# Patient Record
Sex: Female | Born: 2000 | Race: Black or African American | Hispanic: No | Marital: Single | Smoking: Never smoker
Health system: Southern US, Community
[De-identification: ages and names within clinical notes are randomized; demographics above are authoritative.]

## PROBLEM LIST (undated history)

## (undated) DIAGNOSIS — G40909 Epilepsy, unspecified, not intractable, without status epilepticus: Secondary | ICD-10-CM

## (undated) DIAGNOSIS — D649 Anemia, unspecified: Secondary | ICD-10-CM

## (undated) HISTORY — DX: Anemia, unspecified: D64.9

## (undated) HISTORY — DX: Epilepsy, unspecified, not intractable, without status epilepticus: G40.909

---
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
## 2020-02-04 DIAGNOSIS — F4329 Adjustment disorder with other symptoms: Secondary | ICD-10-CM | POA: Diagnosis not present

## 2020-02-04 DIAGNOSIS — G43009 Migraine without aura, not intractable, without status migrainosus: Secondary | ICD-10-CM | POA: Diagnosis not present

## 2020-02-04 DIAGNOSIS — F819 Developmental disorder of scholastic skills, unspecified: Secondary | ICD-10-CM | POA: Diagnosis not present

## 2020-02-04 DIAGNOSIS — G40309 Generalized idiopathic epilepsy and epileptic syndromes, not intractable, without status epilepticus: Secondary | ICD-10-CM | POA: Diagnosis not present

## 2020-02-25 DIAGNOSIS — G40309 Generalized idiopathic epilepsy and epileptic syndromes, not intractable, without status epilepticus: Secondary | ICD-10-CM | POA: Diagnosis not present

## 2020-08-04 DIAGNOSIS — G43009 Migraine without aura, not intractable, without status migrainosus: Secondary | ICD-10-CM | POA: Diagnosis not present

## 2020-08-04 DIAGNOSIS — F819 Developmental disorder of scholastic skills, unspecified: Secondary | ICD-10-CM | POA: Diagnosis not present

## 2020-08-04 DIAGNOSIS — G40309 Generalized idiopathic epilepsy and epileptic syndromes, not intractable, without status epilepticus: Secondary | ICD-10-CM | POA: Diagnosis not present

## 2020-08-04 DIAGNOSIS — F4329 Adjustment disorder with other symptoms: Secondary | ICD-10-CM | POA: Diagnosis not present

## 2020-11-13 DIAGNOSIS — F411 Generalized anxiety disorder: Secondary | ICD-10-CM | POA: Diagnosis not present

## 2020-11-13 DIAGNOSIS — F338 Other recurrent depressive disorders: Secondary | ICD-10-CM | POA: Diagnosis not present

## 2020-11-20 DIAGNOSIS — F411 Generalized anxiety disorder: Secondary | ICD-10-CM | POA: Diagnosis not present

## 2020-11-20 DIAGNOSIS — F338 Other recurrent depressive disorders: Secondary | ICD-10-CM | POA: Diagnosis not present

## 2020-11-27 DIAGNOSIS — F338 Other recurrent depressive disorders: Secondary | ICD-10-CM | POA: Diagnosis not present

## 2020-11-27 DIAGNOSIS — F411 Generalized anxiety disorder: Secondary | ICD-10-CM | POA: Diagnosis not present

## 2020-11-29 DIAGNOSIS — H5213 Myopia, bilateral: Secondary | ICD-10-CM | POA: Diagnosis not present

## 2020-12-18 DIAGNOSIS — F411 Generalized anxiety disorder: Secondary | ICD-10-CM | POA: Diagnosis not present

## 2020-12-18 DIAGNOSIS — F338 Other recurrent depressive disorders: Secondary | ICD-10-CM | POA: Diagnosis not present

## 2020-12-19 DIAGNOSIS — H5213 Myopia, bilateral: Secondary | ICD-10-CM | POA: Diagnosis not present

## 2020-12-25 DIAGNOSIS — F338 Other recurrent depressive disorders: Secondary | ICD-10-CM | POA: Diagnosis not present

## 2020-12-25 DIAGNOSIS — F411 Generalized anxiety disorder: Secondary | ICD-10-CM | POA: Diagnosis not present

## 2021-01-01 DIAGNOSIS — F338 Other recurrent depressive disorders: Secondary | ICD-10-CM | POA: Diagnosis not present

## 2021-01-01 DIAGNOSIS — F411 Generalized anxiety disorder: Secondary | ICD-10-CM | POA: Diagnosis not present

## 2021-01-08 DIAGNOSIS — F411 Generalized anxiety disorder: Secondary | ICD-10-CM | POA: Diagnosis not present

## 2021-01-08 DIAGNOSIS — F338 Other recurrent depressive disorders: Secondary | ICD-10-CM | POA: Diagnosis not present

## 2021-01-18 DIAGNOSIS — F338 Other recurrent depressive disorders: Secondary | ICD-10-CM | POA: Diagnosis not present

## 2021-01-18 DIAGNOSIS — F411 Generalized anxiety disorder: Secondary | ICD-10-CM | POA: Diagnosis not present

## 2021-01-22 DIAGNOSIS — F338 Other recurrent depressive disorders: Secondary | ICD-10-CM | POA: Diagnosis not present

## 2021-01-22 DIAGNOSIS — F411 Generalized anxiety disorder: Secondary | ICD-10-CM | POA: Diagnosis not present

## 2021-01-29 DIAGNOSIS — F338 Other recurrent depressive disorders: Secondary | ICD-10-CM | POA: Diagnosis not present

## 2021-01-29 DIAGNOSIS — F411 Generalized anxiety disorder: Secondary | ICD-10-CM | POA: Diagnosis not present

## 2021-02-05 ENCOUNTER — Ambulatory Visit
Admission: RE | Admit: 2021-02-05 | Discharge: 2021-02-05 | Disposition: A | Discharge status: Home / Self Care | Payer: Disability Insurance | Source: Ambulatory Visit | Attending: *Deleted | Admitting: *Deleted

## 2021-02-05 ENCOUNTER — Other Ambulatory Visit: Payer: Self-pay

## 2021-02-05 ENCOUNTER — Ambulatory Visit
Admission: EM | Admit: 2021-02-05 | Discharge: 2021-02-05 | Disposition: A | Discharge status: Home / Self Care | Payer: Disability Insurance

## 2021-02-05 DIAGNOSIS — M25562 Pain in left knee: Secondary | ICD-10-CM

## 2021-02-05 DIAGNOSIS — F411 Generalized anxiety disorder: Secondary | ICD-10-CM | POA: Diagnosis not present

## 2021-02-05 DIAGNOSIS — M25561 Pain in right knee: Secondary | ICD-10-CM | POA: Insufficient documentation

## 2021-02-05 DIAGNOSIS — F338 Other recurrent depressive disorders: Secondary | ICD-10-CM | POA: Diagnosis not present

## 2021-02-09 DIAGNOSIS — F4329 Adjustment disorder with other symptoms: Secondary | ICD-10-CM | POA: Diagnosis not present

## 2021-02-09 DIAGNOSIS — F819 Developmental disorder of scholastic skills, unspecified: Secondary | ICD-10-CM | POA: Diagnosis not present

## 2021-02-09 DIAGNOSIS — G43009 Migraine without aura, not intractable, without status migrainosus: Secondary | ICD-10-CM | POA: Diagnosis not present

## 2021-02-09 DIAGNOSIS — G40309 Generalized idiopathic epilepsy and epileptic syndromes, not intractable, without status epilepticus: Secondary | ICD-10-CM | POA: Diagnosis not present

## 2021-02-12 DIAGNOSIS — F338 Other recurrent depressive disorders: Secondary | ICD-10-CM | POA: Diagnosis not present

## 2021-02-12 DIAGNOSIS — F411 Generalized anxiety disorder: Secondary | ICD-10-CM | POA: Diagnosis not present

## 2021-02-19 DIAGNOSIS — G40A09 Absence epileptic syndrome, not intractable, without status epilepticus: Secondary | ICD-10-CM | POA: Diagnosis not present

## 2021-02-19 DIAGNOSIS — Z862 Personal history of diseases of the blood and blood-forming organs and certain disorders involving the immune mechanism: Secondary | ICD-10-CM | POA: Diagnosis not present

## 2021-02-19 DIAGNOSIS — Z23 Encounter for immunization: Secondary | ICD-10-CM | POA: Diagnosis not present

## 2021-02-19 DIAGNOSIS — N920 Excessive and frequent menstruation with regular cycle: Secondary | ICD-10-CM | POA: Diagnosis not present

## 2021-02-19 DIAGNOSIS — F411 Generalized anxiety disorder: Secondary | ICD-10-CM | POA: Diagnosis not present

## 2021-02-19 DIAGNOSIS — F5089 Other specified eating disorder: Secondary | ICD-10-CM | POA: Diagnosis not present

## 2021-02-19 DIAGNOSIS — Z0000 Encounter for general adult medical examination without abnormal findings: Secondary | ICD-10-CM | POA: Diagnosis not present

## 2021-02-19 DIAGNOSIS — F338 Other recurrent depressive disorders: Secondary | ICD-10-CM | POA: Diagnosis not present

## 2021-02-26 DIAGNOSIS — F338 Other recurrent depressive disorders: Secondary | ICD-10-CM | POA: Diagnosis not present

## 2021-02-26 DIAGNOSIS — F411 Generalized anxiety disorder: Secondary | ICD-10-CM | POA: Diagnosis not present

## 2021-03-05 DIAGNOSIS — F338 Other recurrent depressive disorders: Secondary | ICD-10-CM | POA: Diagnosis not present

## 2021-03-05 DIAGNOSIS — F411 Generalized anxiety disorder: Secondary | ICD-10-CM | POA: Diagnosis not present

## 2021-03-12 DIAGNOSIS — F411 Generalized anxiety disorder: Secondary | ICD-10-CM | POA: Diagnosis not present

## 2021-03-12 DIAGNOSIS — F338 Other recurrent depressive disorders: Secondary | ICD-10-CM | POA: Diagnosis not present

## 2021-03-26 DIAGNOSIS — F411 Generalized anxiety disorder: Secondary | ICD-10-CM | POA: Diagnosis not present

## 2021-03-26 DIAGNOSIS — F338 Other recurrent depressive disorders: Secondary | ICD-10-CM | POA: Diagnosis not present

## 2021-03-30 DIAGNOSIS — G43009 Migraine without aura, not intractable, without status migrainosus: Secondary | ICD-10-CM | POA: Diagnosis not present

## 2021-03-30 DIAGNOSIS — F4329 Adjustment disorder with other symptoms: Secondary | ICD-10-CM | POA: Diagnosis not present

## 2021-03-30 DIAGNOSIS — G40309 Generalized idiopathic epilepsy and epileptic syndromes, not intractable, without status epilepticus: Secondary | ICD-10-CM | POA: Diagnosis not present

## 2021-03-30 DIAGNOSIS — F819 Developmental disorder of scholastic skills, unspecified: Secondary | ICD-10-CM | POA: Diagnosis not present

## 2021-04-02 DIAGNOSIS — F338 Other recurrent depressive disorders: Secondary | ICD-10-CM | POA: Diagnosis not present

## 2021-04-02 DIAGNOSIS — F411 Generalized anxiety disorder: Secondary | ICD-10-CM | POA: Diagnosis not present

## 2021-04-09 DIAGNOSIS — F338 Other recurrent depressive disorders: Secondary | ICD-10-CM | POA: Diagnosis not present

## 2021-04-09 DIAGNOSIS — F411 Generalized anxiety disorder: Secondary | ICD-10-CM | POA: Diagnosis not present

## 2021-04-23 DIAGNOSIS — F411 Generalized anxiety disorder: Secondary | ICD-10-CM | POA: Diagnosis not present

## 2021-04-23 DIAGNOSIS — F338 Other recurrent depressive disorders: Secondary | ICD-10-CM | POA: Diagnosis not present

## 2021-05-07 DIAGNOSIS — F338 Other recurrent depressive disorders: Secondary | ICD-10-CM | POA: Diagnosis not present

## 2021-05-07 DIAGNOSIS — F411 Generalized anxiety disorder: Secondary | ICD-10-CM | POA: Diagnosis not present

## 2021-05-18 DIAGNOSIS — F411 Generalized anxiety disorder: Secondary | ICD-10-CM | POA: Diagnosis not present

## 2021-05-18 DIAGNOSIS — F338 Other recurrent depressive disorders: Secondary | ICD-10-CM | POA: Diagnosis not present

## 2021-06-01 DIAGNOSIS — F411 Generalized anxiety disorder: Secondary | ICD-10-CM | POA: Diagnosis not present

## 2021-06-01 DIAGNOSIS — F338 Other recurrent depressive disorders: Secondary | ICD-10-CM | POA: Diagnosis not present

## 2021-06-11 DIAGNOSIS — F411 Generalized anxiety disorder: Secondary | ICD-10-CM | POA: Diagnosis not present

## 2021-06-11 DIAGNOSIS — F338 Other recurrent depressive disorders: Secondary | ICD-10-CM | POA: Diagnosis not present

## 2021-06-18 DIAGNOSIS — F338 Other recurrent depressive disorders: Secondary | ICD-10-CM | POA: Diagnosis not present

## 2021-06-18 DIAGNOSIS — F411 Generalized anxiety disorder: Secondary | ICD-10-CM | POA: Diagnosis not present

## 2021-06-25 DIAGNOSIS — F411 Generalized anxiety disorder: Secondary | ICD-10-CM | POA: Diagnosis not present

## 2021-06-25 DIAGNOSIS — F338 Other recurrent depressive disorders: Secondary | ICD-10-CM | POA: Diagnosis not present

## 2021-07-06 DIAGNOSIS — F32A Depression, unspecified: Secondary | ICD-10-CM | POA: Diagnosis not present

## 2021-07-06 DIAGNOSIS — F411 Generalized anxiety disorder: Secondary | ICD-10-CM | POA: Diagnosis not present

## 2021-07-06 DIAGNOSIS — F338 Other recurrent depressive disorders: Secondary | ICD-10-CM | POA: Diagnosis not present

## 2021-07-09 DIAGNOSIS — F411 Generalized anxiety disorder: Secondary | ICD-10-CM | POA: Diagnosis not present

## 2021-07-09 DIAGNOSIS — F338 Other recurrent depressive disorders: Secondary | ICD-10-CM | POA: Diagnosis not present

## 2021-07-16 DIAGNOSIS — F411 Generalized anxiety disorder: Secondary | ICD-10-CM | POA: Diagnosis not present

## 2021-07-16 DIAGNOSIS — F338 Other recurrent depressive disorders: Secondary | ICD-10-CM | POA: Diagnosis not present

## 2021-07-30 DIAGNOSIS — F338 Other recurrent depressive disorders: Secondary | ICD-10-CM | POA: Diagnosis not present

## 2021-07-30 DIAGNOSIS — F411 Generalized anxiety disorder: Secondary | ICD-10-CM | POA: Diagnosis not present

## 2021-08-07 DIAGNOSIS — F411 Generalized anxiety disorder: Secondary | ICD-10-CM | POA: Diagnosis not present

## 2021-08-07 DIAGNOSIS — F338 Other recurrent depressive disorders: Secondary | ICD-10-CM | POA: Diagnosis not present

## 2021-08-24 DIAGNOSIS — F411 Generalized anxiety disorder: Secondary | ICD-10-CM | POA: Diagnosis not present

## 2021-08-24 DIAGNOSIS — F338 Other recurrent depressive disorders: Secondary | ICD-10-CM | POA: Diagnosis not present

## 2021-10-05 DIAGNOSIS — F819 Developmental disorder of scholastic skills, unspecified: Secondary | ICD-10-CM | POA: Diagnosis not present

## 2021-10-05 DIAGNOSIS — G40309 Generalized idiopathic epilepsy and epileptic syndromes, not intractable, without status epilepticus: Secondary | ICD-10-CM | POA: Diagnosis not present

## 2021-10-05 DIAGNOSIS — F4329 Adjustment disorder with other symptoms: Secondary | ICD-10-CM | POA: Diagnosis not present

## 2021-10-05 DIAGNOSIS — G43009 Migraine without aura, not intractable, without status migrainosus: Secondary | ICD-10-CM | POA: Diagnosis not present

## 2021-12-06 ENCOUNTER — Ambulatory Visit (INDEPENDENT_AMBULATORY_CARE_PROVIDER_SITE_OTHER): Payer: Medicaid Other | Admitting: Family Medicine

## 2021-12-06 ENCOUNTER — Encounter: Payer: Self-pay | Admitting: Family Medicine

## 2021-12-06 ENCOUNTER — Other Ambulatory Visit: Payer: Self-pay

## 2021-12-06 VITALS — BP 122/64 | HR 94 | Ht 63.0 in | Wt 199.0 lb

## 2021-12-06 DIAGNOSIS — Z862 Personal history of diseases of the blood and blood-forming organs and certain disorders involving the immune mechanism: Secondary | ICD-10-CM

## 2021-12-06 DIAGNOSIS — Z7689 Persons encountering health services in other specified circumstances: Secondary | ICD-10-CM

## 2021-12-06 DIAGNOSIS — Z124 Encounter for screening for malignant neoplasm of cervix: Secondary | ICD-10-CM

## 2021-12-06 DIAGNOSIS — G40909 Epilepsy, unspecified, not intractable, without status epilepticus: Secondary | ICD-10-CM | POA: Diagnosis not present

## 2021-12-06 MED ORDER — FERROUS SULFATE 325 (65 FE) MG PO TABS: 325.0000 mg | ORAL_TABLET | Freq: Every day | ORAL | 1 refills | Status: AC

## 2021-12-06 NOTE — Progress Notes (Signed)
Date:  12/06/2021   Name:  Gail Hill   DOB:  2001-06-28   MRN:  [id number]   Chief Complaint: No chief complaint on file.  Patient is a 21 year old female who presents for establish care. The patient reports the following problems: referral for neurology and gynecology. Health maintenance has been reviewed by myself     No results found for: NA, K, CO2, GLUCOSE, BUN, CREATININE, CALCIUM, EGFR, GFRNONAA, GLUCOSE No results found for: CHOL, HDL, LDLCALC, LDLDIRECT, TRIG, CHOLHDL No results found for: TSH No results found for: HGBA1C No results found for: WBC, HGB, HCT, MCV, PLT No results found for: ALT, AST, GGT, ALKPHOS, BILITOT No results found for: 25OHVITD2, 25OHVITD3, VD25OH   Review of Systems  Constitutional:  Negative for chills and fever.  HENT:  Negative for drooling, ear discharge, ear pain and sore throat.   Respiratory:  Negative for cough, shortness of breath and wheezing.   Cardiovascular:  Negative for chest pain, palpitations and leg swelling.  Gastrointestinal:  Negative for abdominal pain, blood in stool, constipation, diarrhea and nausea.  Endocrine: Negative for polydipsia.  Genitourinary:  Negative for dysuria, frequency, hematuria and urgency.  Musculoskeletal:  Negative for back pain, myalgias and neck pain.  Skin:  Negative for rash.  Allergic/Immunologic: Negative for environmental allergies.  Neurological:  Negative for dizziness and headaches.  Hematological:  Does not bruise/bleed easily.  Psychiatric/Behavioral:  Negative for suicidal ideas. The patient is not nervous/anxious.    There are no problems to display for this patient.   Not on File  History reviewed. No pertinent surgical history.      Medication list has been reviewed and updated.  No outpatient medications have been marked as taking for the 12/06/21 encounter (Office Visit) with Juline Patch, MD.    No flowsheet data found.  No flowsheet data found.  BP Readings  from Last 3 Encounters:  No data found for BP    Physical Exam Vitals and nursing note reviewed.  Constitutional:      Appearance: She is well-developed.  HENT:     Head: Normocephalic.     Right Ear: Tympanic membrane, ear canal and external ear normal.     Left Ear: Tympanic membrane, ear canal and external ear normal.     Nose: Nose normal.     Mouth/Throat:     Lips: Pink.     Mouth: Mucous membranes are moist. Mucous membranes are pale.  Eyes:     General: Lids are everted, no foreign bodies appreciated. No scleral icterus.       Left eye: No foreign body or hordeolum.     Conjunctiva/sclera: Conjunctivae normal.     Right eye: Right conjunctiva is not injected.     Left eye: Left conjunctiva is not injected.     Pupils: Pupils are equal, round, and reactive to light.  Neck:     Thyroid: No thyromegaly.     Vascular: No JVD.     Trachea: No tracheal deviation.  Cardiovascular:     Rate and Rhythm: Normal rate and regular rhythm.     Heart sounds: Normal heart sounds. No murmur heard.   No friction rub. No gallop.  Pulmonary:     Effort: Pulmonary effort is normal. No respiratory distress.     Breath sounds: Normal breath sounds. No wheezing, rhonchi or rales.  Chest:     Chest wall: No tenderness.  Abdominal:     General: Bowel sounds are  normal. There is no distension.     Palpations: Abdomen is soft. There is no mass.     Tenderness: There is no abdominal tenderness. There is no right CVA tenderness, left CVA tenderness, guarding or rebound.     Hernia: No hernia is present.  Musculoskeletal:        General: No tenderness. Normal range of motion.     Cervical back: Normal range of motion and neck supple.  Lymphadenopathy:     Cervical: No cervical adenopathy.  Skin:    General: Skin is warm.     Findings: No rash.  Neurological:     Mental Status: She is alert and oriented to person, place, and time.     Cranial Nerves: No cranial nerve deficit.     Motor:  No weakness.     Coordination: Coordination normal.     Deep Tendon Reflexes: Reflexes normal.  Psychiatric:        Mood and Affect: Mood is not anxious or depressed.    Wt Readings from Last 3 Encounters:  No data found for Wt    There were no vitals taken for this visit.  Assessment and Plan:  1. Establishing care with new doctor, encounter for Patient establishing care with new physician.  Review of systems medications as follows.  2. Seizure disorder [facility]) Patient with this seizure disorder which is being followed by pediatric neurology and [facility].  She would like to maintain this referral and we will refer back to them for this in the meantime she has enough medication to continue until return. - Ambulatory referral to Neurology  3. Cervical cancer screening Discussed cervical cancer screening and we will refer to Dr. Leafy Ro for cervical cancer surveillance. - Ambulatory referral to Obstetrics / Gynecology  4. History of anemia Patient has a history of anemia because most likely of heavy periods.  Earlier in 2022 it was noted that there was a mild decrease of her hemoglobin to 10.6 and it was microcytic in nature most likely secondary to iron deficiency anemia due to menorrhagia.  We will refer to gynecology and we went on had initiated at ferrous sulfate 325 mg daily. - ferrous sulfate 325 (65 FE) MG tablet; Take 1 tablet (325 mg total) by mouth daily with breakfast.  Dispense: 90 tablet; Refill: 1

## 2022-01-02 DIAGNOSIS — H5213 Myopia, bilateral: Secondary | ICD-10-CM | POA: Diagnosis not present

## 2022-02-01 ENCOUNTER — Encounter: Payer: Self-pay | Admitting: Obstetrics and Gynecology

## 2022-03-01 ENCOUNTER — Encounter: Payer: Self-pay | Admitting: Obstetrics

## 2022-04-05 ENCOUNTER — Encounter: Payer: Medicaid Other | Admitting: Obstetrics

## 2022-04-26 DIAGNOSIS — F411 Generalized anxiety disorder: Secondary | ICD-10-CM | POA: Diagnosis not present

## 2022-04-26 DIAGNOSIS — F338 Other recurrent depressive disorders: Secondary | ICD-10-CM | POA: Diagnosis not present

## 2022-05-01 ENCOUNTER — Encounter: Payer: Medicaid Other | Admitting: Obstetrics

## 2022-06-01 DIAGNOSIS — F411 Generalized anxiety disorder: Secondary | ICD-10-CM | POA: Diagnosis not present

## 2022-06-01 DIAGNOSIS — F338 Other recurrent depressive disorders: Secondary | ICD-10-CM | POA: Diagnosis not present

## 2022-06-03 ENCOUNTER — Ambulatory Visit (INDEPENDENT_AMBULATORY_CARE_PROVIDER_SITE_OTHER): Payer: Medicaid Other | Admitting: Obstetrics

## 2022-06-03 ENCOUNTER — Other Ambulatory Visit (HOSPITAL_COMMUNITY)
Admission: RE | Admit: 2022-06-03 | Discharge: 2022-06-03 | Disposition: A | Discharge status: Home / Self Care | Payer: Medicaid Other | Source: Ambulatory Visit | Attending: Obstetrics | Admitting: Obstetrics

## 2022-06-03 ENCOUNTER — Encounter: Payer: Self-pay | Admitting: Obstetrics

## 2022-06-03 VITALS — BP 118/77 | HR 76 | Ht 63.0 in | Wt 204.1 lb

## 2022-06-03 DIAGNOSIS — Z124 Encounter for screening for malignant neoplasm of cervix: Secondary | ICD-10-CM | POA: Diagnosis not present

## 2022-06-03 DIAGNOSIS — Z01419 Encounter for gynecological examination (general) (routine) without abnormal findings: Secondary | ICD-10-CM | POA: Diagnosis not present

## 2022-06-03 DIAGNOSIS — E049 Nontoxic goiter, unspecified: Secondary | ICD-10-CM | POA: Diagnosis not present

## 2022-06-03 DIAGNOSIS — D500 Iron deficiency anemia secondary to blood loss (chronic): Secondary | ICD-10-CM

## 2022-06-03 DIAGNOSIS — Z0000 Encounter for general adult medical examination without abnormal findings: Secondary | ICD-10-CM | POA: Diagnosis not present
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
## 2022-06-04 LAB — CBC
Hematocrit: 34.6 % (ref 34.0–46.6)
Hemoglobin: 10.8 g/dL — ABNORMAL LOW (ref 11.1–15.9)
MCH: 25.7 pg — ABNORMAL LOW (ref 26.6–33.0)
MCHC: 31.2 g/dL — ABNORMAL LOW (ref 31.5–35.7)
MCV: 82 fL (ref 79–97)
Platelets: 395 10*3/uL (ref 150–450)
RBC: 4.21 x10E6/uL (ref 3.77–5.28)
RDW: 13.6 % (ref 11.7–15.4)
WBC: 4.8 10*3/uL (ref 3.4–10.8)

## 2022-06-04 LAB — TSH: TSH: 1.21 u[IU]/mL (ref 0.450–4.500)

## 2022-06-06 LAB — CYTOLOGY - PAP
Adequacy: ABSENT
Diagnosis: NEGATIVE

## 2022-06-07 ENCOUNTER — Encounter: Payer: Self-pay | Admitting: Obstetrics

## 2022-06-15 DIAGNOSIS — F338 Other recurrent depressive disorders: Secondary | ICD-10-CM | POA: Diagnosis not present

## 2022-06-15 DIAGNOSIS — F411 Generalized anxiety disorder: Secondary | ICD-10-CM | POA: Diagnosis not present

## 2022-06-26 IMAGING — CR DG KNEE COMPLETE 4+V*R*
1 series · 4 of 4 positions shown · non-contrast
Comparison: None.

CLINICAL DATA: Chronic bilateral knee pain

EXAM:
RIGHT KNEE - COMPLETE 4+ VIEW

[Series 1: dg knee complete 4 views right · 0.14mm/px · 4 of 4 slices shown]
[im 1/4]
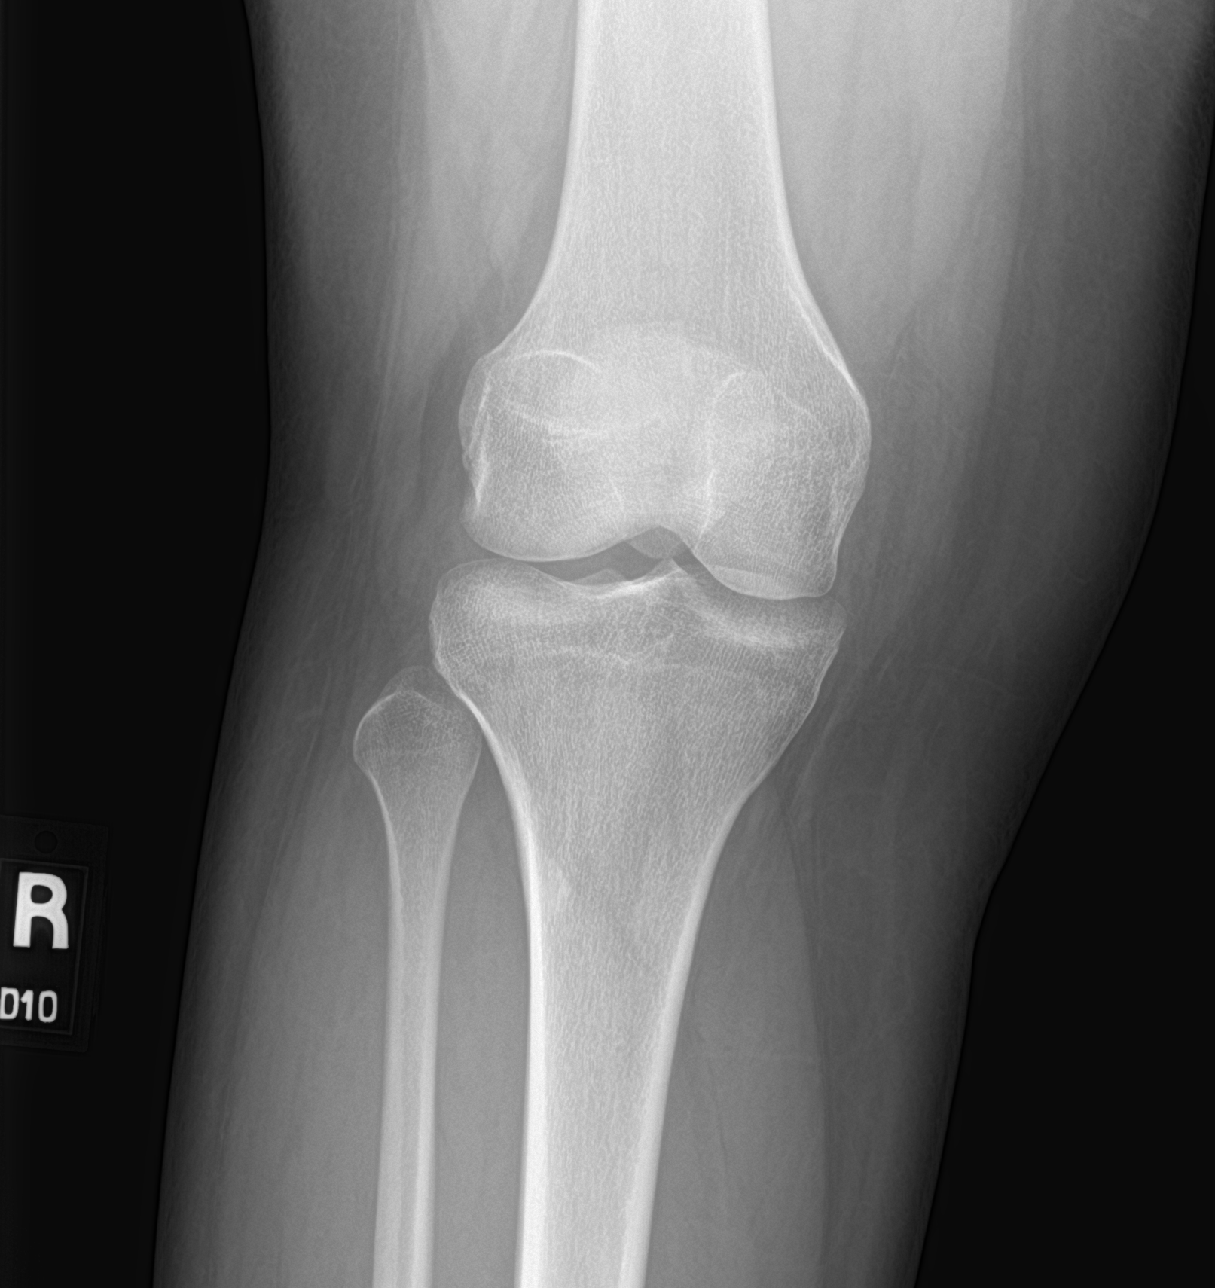
[im 2/4]
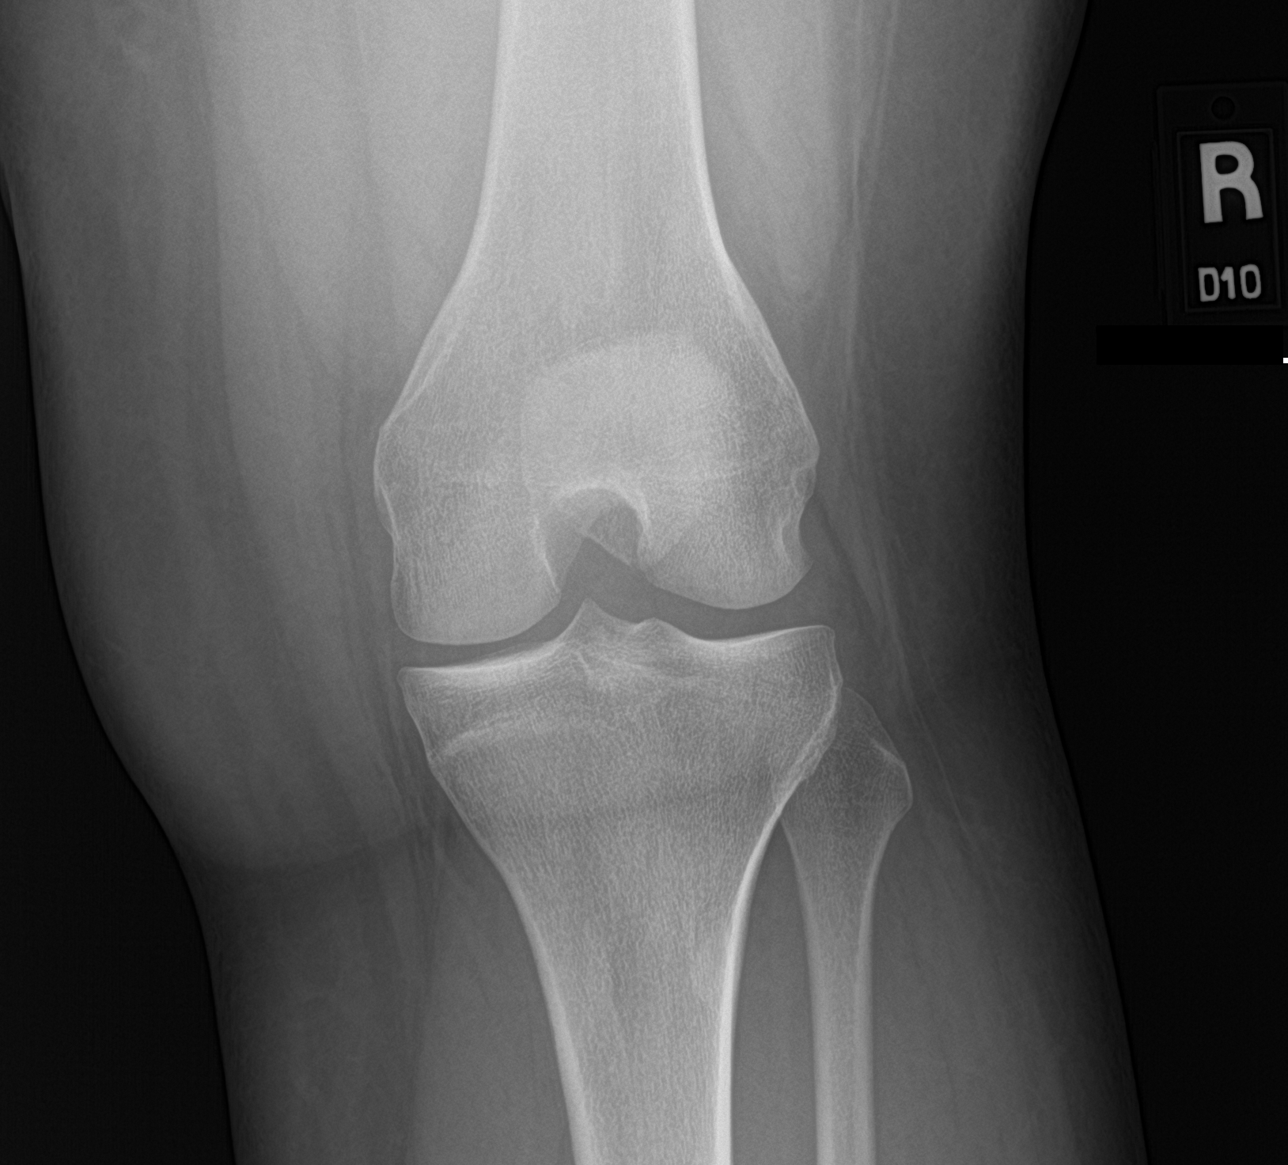
[im 3/4]
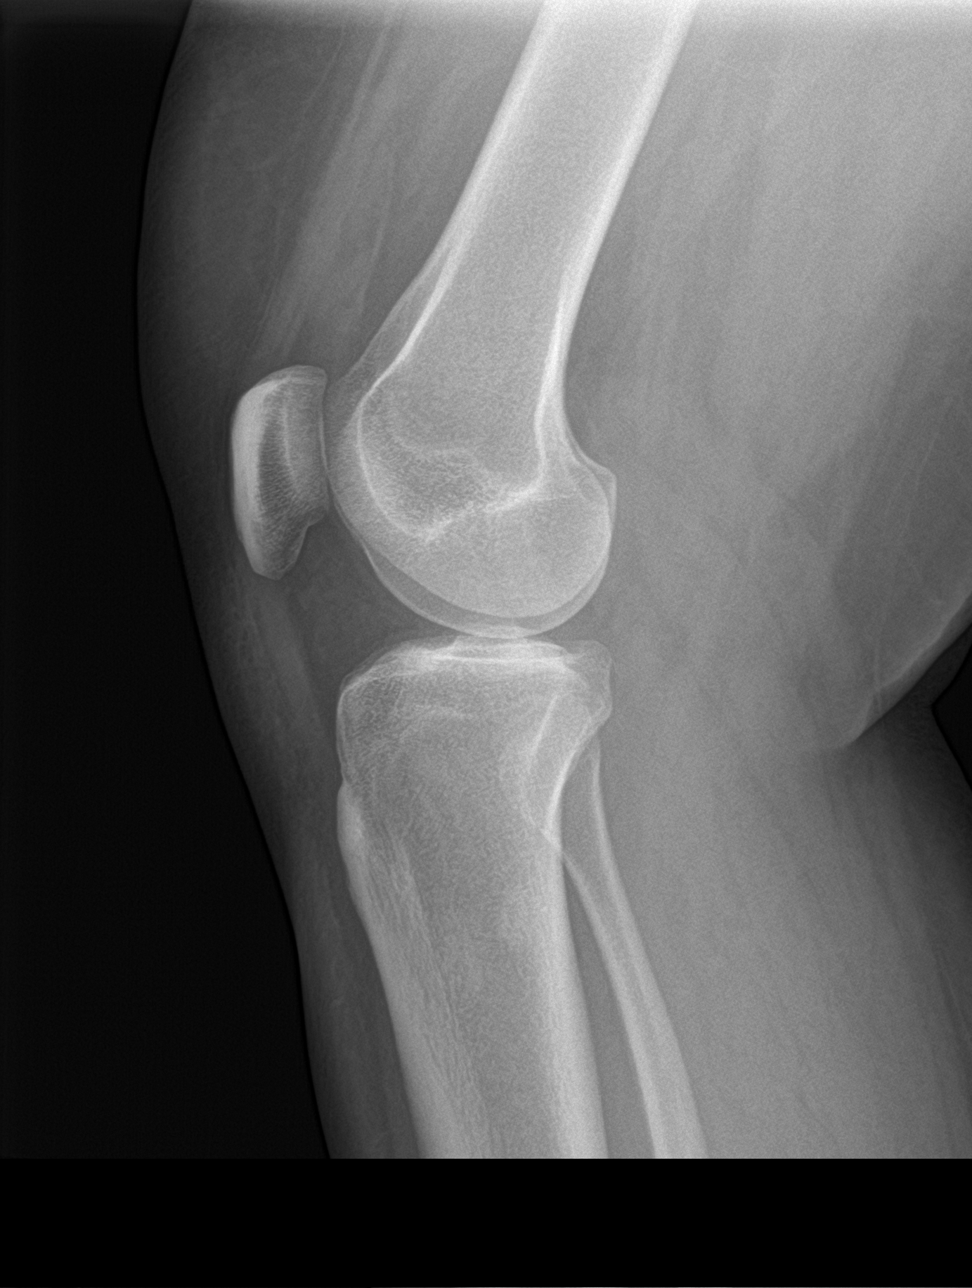
[im 4/4]
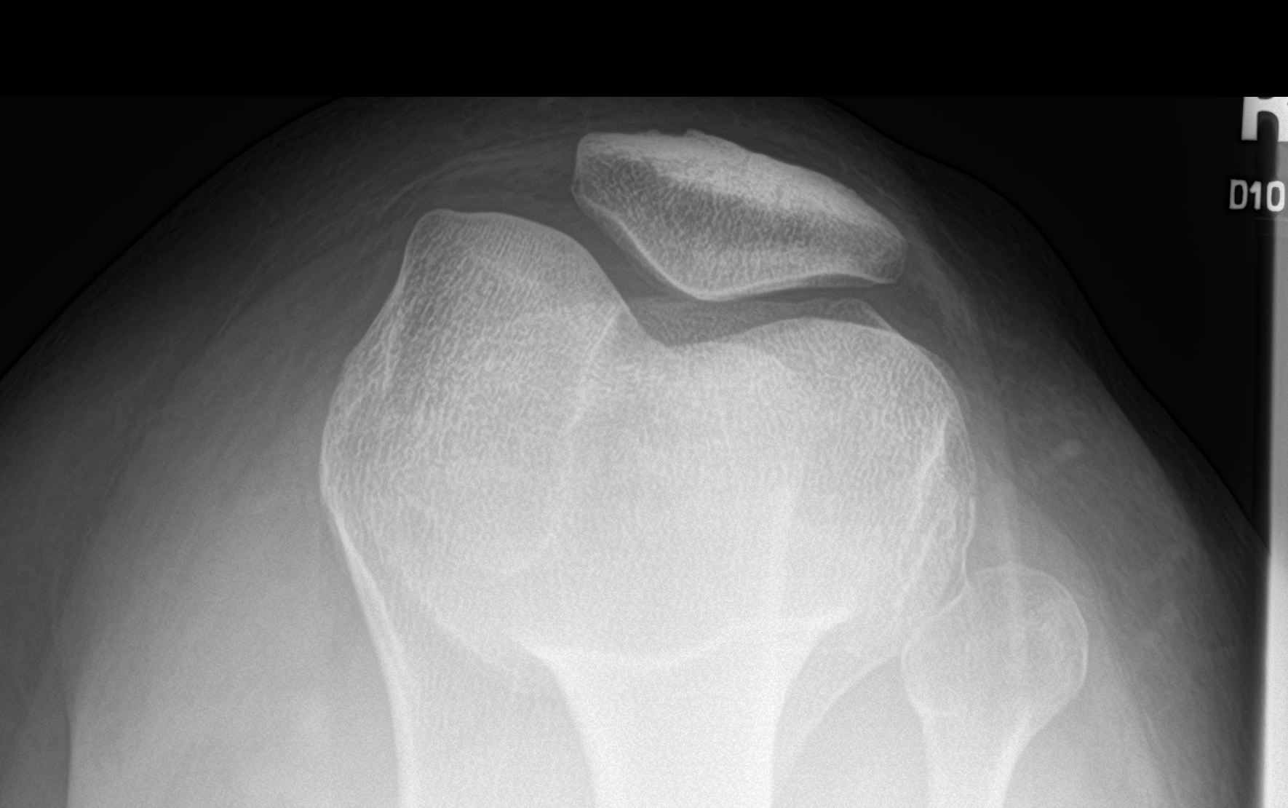

[4 of 4 positions shown; findings below may reference images not displayed]

FINDINGS: No evidence of fracture, dislocation, or joint effusion. No evidence
of arthropathy or other focal bone abnormality. Soft tissues are
unremarkable.
IMPRESSION: Normal radiographs.

## 2022-06-29 DIAGNOSIS — F411 Generalized anxiety disorder: Secondary | ICD-10-CM | POA: Diagnosis not present

## 2022-06-29 DIAGNOSIS — F338 Other recurrent depressive disorders: Secondary | ICD-10-CM | POA: Diagnosis not present

## 2022-07-17 DIAGNOSIS — F4329 Adjustment disorder with other symptoms: Secondary | ICD-10-CM | POA: Diagnosis not present

## 2022-07-17 DIAGNOSIS — G40309 Generalized idiopathic epilepsy and epileptic syndromes, not intractable, without status epilepticus: Secondary | ICD-10-CM | POA: Diagnosis not present

## 2022-07-17 DIAGNOSIS — F819 Developmental disorder of scholastic skills, unspecified: Secondary | ICD-10-CM | POA: Diagnosis not present

## 2022-07-17 DIAGNOSIS — G43009 Migraine without aura, not intractable, without status migrainosus: Secondary | ICD-10-CM | POA: Diagnosis not present

## 2022-07-20 DIAGNOSIS — F338 Other recurrent depressive disorders: Secondary | ICD-10-CM | POA: Diagnosis not present

## 2022-07-20 DIAGNOSIS — F411 Generalized anxiety disorder: Secondary | ICD-10-CM | POA: Diagnosis not present

## 2022-08-03 DIAGNOSIS — F338 Other recurrent depressive disorders: Secondary | ICD-10-CM | POA: Diagnosis not present

## 2022-08-03 DIAGNOSIS — F411 Generalized anxiety disorder: Secondary | ICD-10-CM | POA: Diagnosis not present

## 2022-08-12 DIAGNOSIS — G40409 Other generalized epilepsy and epileptic syndromes, not intractable, without status epilepticus: Secondary | ICD-10-CM | POA: Diagnosis not present

## 2022-08-17 DIAGNOSIS — F338 Other recurrent depressive disorders: Secondary | ICD-10-CM | POA: Diagnosis not present

## 2022-08-17 DIAGNOSIS — F411 Generalized anxiety disorder: Secondary | ICD-10-CM | POA: Diagnosis not present

## 2022-09-29 DIAGNOSIS — F411 Generalized anxiety disorder: Secondary | ICD-10-CM | POA: Diagnosis not present

## 2022-09-29 DIAGNOSIS — F338 Other recurrent depressive disorders: Secondary | ICD-10-CM | POA: Diagnosis not present

## 2022-12-02 DIAGNOSIS — F338 Other recurrent depressive disorders: Secondary | ICD-10-CM | POA: Diagnosis not present

## 2022-12-02 DIAGNOSIS — F411 Generalized anxiety disorder: Secondary | ICD-10-CM | POA: Diagnosis not present

## 2022-12-23 DIAGNOSIS — G40309 Generalized idiopathic epilepsy and epileptic syndromes, not intractable, without status epilepticus: Secondary | ICD-10-CM | POA: Diagnosis not present

## 2022-12-23 DIAGNOSIS — G43009 Migraine without aura, not intractable, without status migrainosus: Secondary | ICD-10-CM | POA: Diagnosis not present

## 2022-12-23 DIAGNOSIS — F819 Developmental disorder of scholastic skills, unspecified: Secondary | ICD-10-CM | POA: Diagnosis not present

## 2022-12-23 DIAGNOSIS — F4329 Adjustment disorder with other symptoms: Secondary | ICD-10-CM | POA: Diagnosis not present

## 2022-12-26 DIAGNOSIS — F338 Other recurrent depressive disorders: Secondary | ICD-10-CM | POA: Diagnosis not present

## 2022-12-26 DIAGNOSIS — F411 Generalized anxiety disorder: Secondary | ICD-10-CM | POA: Diagnosis not present

## 2023-02-03 DIAGNOSIS — F4329 Adjustment disorder with other symptoms: Secondary | ICD-10-CM | POA: Diagnosis not present

## 2023-02-03 DIAGNOSIS — G40309 Generalized idiopathic epilepsy and epileptic syndromes, not intractable, without status epilepticus: Secondary | ICD-10-CM | POA: Diagnosis not present

## 2023-02-03 DIAGNOSIS — G43009 Migraine without aura, not intractable, without status migrainosus: Secondary | ICD-10-CM | POA: Diagnosis not present

## 2023-02-03 DIAGNOSIS — F819 Developmental disorder of scholastic skills, unspecified: Secondary | ICD-10-CM | POA: Diagnosis not present

## 2023-02-06 DIAGNOSIS — F338 Other recurrent depressive disorders: Secondary | ICD-10-CM | POA: Diagnosis not present

## 2023-02-06 DIAGNOSIS — F411 Generalized anxiety disorder: Secondary | ICD-10-CM | POA: Diagnosis not present

## 2023-02-27 DIAGNOSIS — F411 Generalized anxiety disorder: Secondary | ICD-10-CM | POA: Diagnosis not present

## 2023-02-27 DIAGNOSIS — F338 Other recurrent depressive disorders: Secondary | ICD-10-CM | POA: Diagnosis not present

## 2023-04-21 DIAGNOSIS — G40909 Epilepsy, unspecified, not intractable, without status epilepticus: Secondary | ICD-10-CM | POA: Diagnosis not present

## 2023-04-21 DIAGNOSIS — E559 Vitamin D deficiency, unspecified: Secondary | ICD-10-CM | POA: Diagnosis not present

## 2023-04-21 DIAGNOSIS — E039 Hypothyroidism, unspecified: Secondary | ICD-10-CM | POA: Diagnosis not present

## 2023-04-21 DIAGNOSIS — G43909 Migraine, unspecified, not intractable, without status migrainosus: Secondary | ICD-10-CM | POA: Diagnosis not present

## 2023-04-21 DIAGNOSIS — D519 Vitamin B12 deficiency anemia, unspecified: Secondary | ICD-10-CM | POA: Diagnosis not present

## 2023-04-21 DIAGNOSIS — E54 Ascorbic acid deficiency: Secondary | ICD-10-CM | POA: Diagnosis not present

## 2023-04-21 DIAGNOSIS — E118 Type 2 diabetes mellitus with unspecified complications: Secondary | ICD-10-CM | POA: Diagnosis not present

## 2023-04-21 DIAGNOSIS — G479 Sleep disorder, unspecified: Secondary | ICD-10-CM | POA: Diagnosis not present

## 2023-04-21 DIAGNOSIS — E612 Magnesium deficiency: Secondary | ICD-10-CM | POA: Diagnosis not present

## 2023-04-24 DIAGNOSIS — F411 Generalized anxiety disorder: Secondary | ICD-10-CM | POA: Diagnosis not present

## 2023-04-24 DIAGNOSIS — F338 Other recurrent depressive disorders: Secondary | ICD-10-CM | POA: Diagnosis not present

## 2023-04-29 DIAGNOSIS — Z30014 Encounter for initial prescription of intrauterine contraceptive device: Secondary | ICD-10-CM | POA: Diagnosis not present

## 2023-05-05 DIAGNOSIS — Z30014 Encounter for initial prescription of intrauterine contraceptive device: Secondary | ICD-10-CM | POA: Diagnosis not present

## 2023-05-05 DIAGNOSIS — Z30430 Encounter for insertion of intrauterine contraceptive device: Secondary | ICD-10-CM | POA: Diagnosis not present

## 2023-05-15 DIAGNOSIS — F411 Generalized anxiety disorder: Secondary | ICD-10-CM | POA: Diagnosis not present

## 2023-05-15 DIAGNOSIS — F338 Other recurrent depressive disorders: Secondary | ICD-10-CM | POA: Diagnosis not present

## 2023-06-12 DIAGNOSIS — F411 Generalized anxiety disorder: Secondary | ICD-10-CM | POA: Diagnosis not present

## 2023-06-12 DIAGNOSIS — F338 Other recurrent depressive disorders: Secondary | ICD-10-CM | POA: Diagnosis not present

## 2023-06-19 ENCOUNTER — Ambulatory Visit (INDEPENDENT_AMBULATORY_CARE_PROVIDER_SITE_OTHER): Payer: Medicaid Other | Admitting: Family Medicine

## 2023-06-19 ENCOUNTER — Telehealth: Payer: Self-pay

## 2023-06-19 ENCOUNTER — Encounter: Payer: Self-pay | Admitting: Family Medicine

## 2023-06-19 VITALS — BP 120/78 | HR 100 | Ht 63.0 in | Wt 205.0 lb

## 2023-06-19 DIAGNOSIS — G43809 Other migraine, not intractable, without status migrainosus: Secondary | ICD-10-CM

## 2023-06-19 DIAGNOSIS — M7918 Myalgia, other site: Secondary | ICD-10-CM | POA: Diagnosis not present

## 2023-06-19 LAB — POC COVID19 BINAXNOW: SARS Coronavirus 2 Ag: NEGATIVE

## 2023-06-19 NOTE — Telephone Encounter (Signed)
Called pt and left message for her to return call

## 2023-06-19 NOTE — Progress Notes (Signed)
Date:  06/19/2023   Name:  Gail Hill   DOB:  April 18, 2001   MRN:  [id number]   Chief Complaint: Migraine (Monday )  Migraine  This is a new (iud 6/17) problem. Episode onset: headache began 7/29. The problem occurs constantly. The problem has been waxing and waning. The pain is located in the Frontal and retro-orbital region. The pain radiates to the upper back. Similar to prior headaches: "way worse". The quality of the pain is described as throbbing. The pain is at a severity of 5/10 (relieved from 8/10 with tylenol). The pain is moderate. Associated symptoms include muscle aches, nausea, neck pain and photophobia. Pertinent negatives include no abdominal pain, blurred vision, dizziness, eye watering, facial sweating, hearing loss, loss of balance, numbness, phonophobia, rhinorrhea, scalp tenderness, sinus pressure, tingling, tinnitus, visual change or weakness. The symptoms are aggravated by bright light and menstrual cycle. She has tried acetaminophen for the symptoms. The treatment provided moderate relief. Her past medical history is significant for migraine headaches and migraines in the family.    No results found for: "NA", "K", "CO2", "GLUCOSE", "BUN", "CREATININE", "CALCIUM", "EGFR", "GFRNONAA" No results found for: "CHOL", "HDL", "LDLCALC", "LDLDIRECT", "TRIG", "CHOLHDL" Lab Results  Component Value Date   TSH 1.210 06/03/2022   No results found for: "HGBA1C" Lab Results  Component Value Date   WBC 4.8 06/03/2022   HGB 10.8 (L) 06/03/2022   HCT 34.6 06/03/2022   MCV 82 06/03/2022   PLT 395 06/03/2022   No results found for: "ALT", "AST", "GGT", "ALKPHOS", "BILITOT" No results found for: "25OHVITD2", "25OHVITD3", "VD25OH"   Review of Systems  HENT:  Negative for hearing loss, rhinorrhea, sinus pressure and tinnitus.   Eyes:  Positive for photophobia. Negative for blurred vision.  Respiratory:  Negative for shortness of breath and wheezing.   Cardiovascular:  Negative  for chest pain and palpitations.  Gastrointestinal:  Positive for nausea. Negative for abdominal pain.  Musculoskeletal:  Positive for neck pain.  Neurological:  Negative for dizziness, tingling, weakness, numbness and loss of balance.    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    History reviewed. No pertinent surgical history.  Social History   Tobacco Use   Smoking status: Never   Smokeless tobacco: Never  Vaping Use   Vaping status: Never Used  Substance Use Topics   Alcohol use: Never   Drug use: Never     Medication list has been reviewed and updated.  Current Meds  Medication Sig   ferrous sulfate 325 (65 FE) MG tablet Take 1 tablet (325 mg total) by mouth daily with breakfast.   lamoTRIgine 100 MG TBDP neurology   norethindrone (MICRONOR) 0.35 MG tablet Take 1 tablet by mouth daily.       06/19/2023    4:44 PM 12/06/2021    3:01 PM  GAD 7 : Generalized Anxiety Score  Nervous, Anxious, on Edge 0 0  Control/stop worrying 0 0  Worry too much - different things 0 0  Trouble relaxing 0 0  Restless 0 0  Easily annoyed or irritable 0 1  Afraid - awful might happen 0 0  Total GAD 7 Score 0 1  Anxiety Difficulty Not difficult at all Somewhat difficult       06/19/2023  4:44 PM 12/06/2021    3:00 PM  Depression screen PHQ 2/9  Decreased Interest 0 0  Down, Depressed, Hopeless 0 1  PHQ - 2 Score 0 1  Altered sleeping 0 1  Tired, decreased energy 0 1  Change in appetite 0 0  Feeling bad or failure about yourself  0 0  Trouble concentrating 0 1  Moving slowly or fidgety/restless 0 1  Suicidal thoughts 0 0  PHQ-9 Score 0 5  Difficult doing work/chores Not difficult at all Somewhat difficult    BP Readings from Last 3 Encounters:  06/19/23 120/78  06/03/22  118/77  12/06/21 122/64    Physical Exam Vitals and nursing note reviewed. Exam conducted with a chaperone present.  Constitutional:      General: She is not in acute distress.    Appearance: She is not diaphoretic.  HENT:     Head: Normocephalic and atraumatic.     Right Ear: Tympanic membrane, ear canal and external ear normal.     Left Ear: Tympanic membrane, ear canal and external ear normal.     Nose: Nose normal.  Eyes:     General: Lids are normal.        Right eye: No discharge.        Left eye: No discharge.     Extraocular Movements: Extraocular movements intact.     Conjunctiva/sclera: Conjunctivae normal.     Pupils: Pupils are equal, round, and reactive to light.     Funduscopic exam:    Right eye: Red reflex present.        Left eye: Red reflex present. Neck:     Thyroid: No thyromegaly.     Vascular: No JVD.  Cardiovascular:     Rate and Rhythm: Normal rate and regular rhythm.     Heart sounds: Normal heart sounds. No murmur heard.    No friction rub. No gallop.  Pulmonary:     Effort: Pulmonary effort is normal.     Breath sounds: Normal breath sounds. No wheezing or rhonchi.  Musculoskeletal:     Cervical back: Normal range of motion and neck supple. No rigidity, spasms, tenderness or bony tenderness. Normal range of motion.  Lymphadenopathy:     Cervical: No cervical adenopathy.  Skin:    General: Skin is warm.  Neurological:     Mental Status: She is alert.     Cranial Nerves: Cranial nerves 2-12 are intact. No cranial nerve deficit.     Sensory: Sensation is intact.     Motor: Motor function is intact. No weakness.     Coordination: Romberg sign negative.     Deep Tendon Reflexes: Reflexes are normal and symmetric.     Reflex Scores:      Tricep reflexes are 2+ on the right side and 2+ on the left side.      Bicep reflexes are 2+ on the right side and 2+ on the left side.      Patellar reflexes are 2+ on the right side and 2+ on the left  side.   Wt Readings from Last 3 Encounters:  06/19/23 205 lb (93 kg)  06/03/22 204 lb 1.6 oz (92.6 kg)  12/06/21 199 lb (90.3 kg)    BP 120/78   Pulse 100   Ht 5\' 3"  (1.6 m)   Wt 205 lb (93 kg)   SpO2 98%   BMI 36.31 kg/m   Assessment and Plan: 1. Myalgia, multiple sites New onset.  Persistent.  Will check COVID because  of exacerbation and persistence.  COVID is negative. - POC COVID-19  2. Migraine variant with headache Patient has a history of migraines and there is a family history of migraines as well previous history of headaches have been associated with her..  Patient recently had a Mirena IUD placement in June 17 and the onset of headache which is frontal and retro-orbital bilateral on July 29.  There is photosensitivity associated with this headache but no focal motor or sensory concerns.  There is no focal weakness or sensory or neurologic deficit on physical examination.  Pain is controlled to some extent but never totally resolved.  Patient has a pediatric neurologist is in the process of being in progress to an adult neurologist.  Patient will call to inquire about being evaluated for her history of migraines particularly after the insertion of her Mirena.    Elizabeth Sauer, MD

## 2023-06-20 ENCOUNTER — Encounter: Payer: Self-pay | Admitting: Family Medicine

## 2023-06-23 DIAGNOSIS — T8332XA Displacement of intrauterine contraceptive device, initial encounter: Secondary | ICD-10-CM | POA: Diagnosis not present

## 2023-06-23 DIAGNOSIS — Z30431 Encounter for routine checking of intrauterine contraceptive device: Secondary | ICD-10-CM | POA: Diagnosis not present

## 2023-07-09 ENCOUNTER — Telehealth: Payer: Self-pay | Admitting: Family Medicine

## 2023-07-09 NOTE — Telephone Encounter (Signed)
Copied from CRM [id number]. Topic: Referral - Request for Referral >> Jul 09, 2023  3:55 PM Phill Myron wrote: Pt Gail Hill is requesting a referral to [facility] in [place]   [phone]

## 2023-07-10 ENCOUNTER — Other Ambulatory Visit: Payer: Self-pay

## 2023-07-11 ENCOUNTER — Other Ambulatory Visit: Payer: Self-pay

## 2023-07-11 DIAGNOSIS — G40909 Epilepsy, unspecified, not intractable, without status epilepticus: Secondary | ICD-10-CM

## 2023-07-11 DIAGNOSIS — G43809 Other migraine, not intractable, without status migrainosus: Secondary | ICD-10-CM

## 2023-07-11 NOTE — Progress Notes (Signed)
Placed ref to [facility] in [place] [phone]

## 2023-07-14 ENCOUNTER — Encounter: Payer: Self-pay | Admitting: Family Medicine

## 2023-07-14 ENCOUNTER — Other Ambulatory Visit: Payer: Self-pay

## 2023-07-14 DIAGNOSIS — R051 Acute cough: Secondary | ICD-10-CM

## 2023-07-14 MED ORDER — BENZONATATE 100 MG PO CAPS
100.0000 mg | ORAL_CAPSULE | Freq: Two times a day (BID) | ORAL | 0 refills | Status: DC | PRN
Start: 2023-07-14 — End: 2023-07-31

## 2023-07-17 ENCOUNTER — Telehealth: Payer: Self-pay

## 2023-07-17 DIAGNOSIS — G40309 Generalized idiopathic epilepsy and epileptic syndromes, not intractable, without status epilepticus: Secondary | ICD-10-CM | POA: Diagnosis not present

## 2023-07-17 DIAGNOSIS — G43009 Migraine without aura, not intractable, without status migrainosus: Secondary | ICD-10-CM | POA: Diagnosis not present

## 2023-07-17 DIAGNOSIS — F4329 Adjustment disorder with other symptoms: Secondary | ICD-10-CM | POA: Diagnosis not present

## 2023-07-17 DIAGNOSIS — F819 Developmental disorder of scholastic skills, unspecified: Secondary | ICD-10-CM | POA: Diagnosis not present

## 2023-07-17 NOTE — Telephone Encounter (Signed)
Received form stating that patient needs to transition over to [facility] neuro for continued care. I spoke with pt's mom and asked if we need to send over a ref to [facility]. She stated she will call and verify tomorrow.

## 2023-07-24 DIAGNOSIS — F338 Other recurrent depressive disorders: Secondary | ICD-10-CM | POA: Diagnosis not present

## 2023-07-24 DIAGNOSIS — F411 Generalized anxiety disorder: Secondary | ICD-10-CM | POA: Diagnosis not present

## 2023-07-30 DIAGNOSIS — T8332XD Displacement of intrauterine contraceptive device, subsequent encounter: Secondary | ICD-10-CM | POA: Diagnosis not present

## 2023-07-30 DIAGNOSIS — T8332XA Displacement of intrauterine contraceptive device, initial encounter: Secondary | ICD-10-CM | POA: Diagnosis not present

## 2023-07-31 ENCOUNTER — Ambulatory Visit (INDEPENDENT_AMBULATORY_CARE_PROVIDER_SITE_OTHER): Payer: Medicaid Other | Admitting: Pediatrics

## 2023-07-31 ENCOUNTER — Encounter: Payer: Self-pay | Admitting: Pediatrics

## 2023-07-31 VITALS — BP 102/65 | HR 80 | Temp 97.5°F | Ht 63.8 in | Wt 207.4 lb

## 2023-07-31 DIAGNOSIS — G40A09 Absence epileptic syndrome, not intractable, without status epilepticus: Secondary | ICD-10-CM | POA: Diagnosis not present

## 2023-07-31 DIAGNOSIS — Z7689 Persons encountering health services in other specified circumstances: Secondary | ICD-10-CM

## 2023-07-31 DIAGNOSIS — F419 Anxiety disorder, unspecified: Secondary | ICD-10-CM

## 2023-07-31 DIAGNOSIS — R0683 Snoring: Secondary | ICD-10-CM | POA: Diagnosis not present

## 2023-07-31 NOTE — Progress Notes (Signed)
   Establish care  BP 102/65   Pulse 80   Temp (!) 97.5 F (36.4 C) (Oral)   Ht 5' 3.8" (1.621 m)   Wt 207 lb 6.4 oz (94.1 kg)   LMP 07/11/2023 (Exact Date)   SpO2 98%   BMI 35.82 kg/m    Subjective:    Patient ID: Gail Hill, female    DOB: 2001-03-20, 22 y.o.   MRN: [id number]  HPI: Gail Hill is a 22 y.o. female  Chief Complaint  Patient presents with   Seizures    Patient states she is in the process from switching to a pediatric neurologist to an adult neurologist   ADHD   Anxiety Has started therapy, she has good relationship w therapist Concerned about possible ADHD, has no been diagnosed Did have trouble with grades growing up She did get in trouble a lot She sees therapist biweekly Still having some breakthrough anxiety She is not interested in pharmacologic treatment at this time  Seizure disoder She is on lamotrigine 100mg  BID Follows with [facility] Seizure free for 1 year  Snoring Poor sleep Wakes up throughout the night Mom has sleep apnea Unsure how bad snoring is Daytime somnolence every day  Relevant past medical, surgical, family and social history reviewed and updated as indicated. Interim medical history since our last visit reviewed. Allergies and medications reviewed and updated.  ROS per HPI unless specifically indicated above     Objective:    BP 102/65   Pulse 80   Temp (!) 97.5 F (36.4 C) (Oral)   Ht 5' 3.8" (1.621 m)   Wt 207 lb 6.4 oz (94.1 kg)   LMP 07/11/2023 (Exact Date)   SpO2 98%   BMI 35.82 kg/m   Wt Readings from Last 3 Encounters:  07/31/23 207 lb 6.4 oz (94.1 kg)  06/19/23 205 lb (93 kg)  06/03/22 204 lb 1.6 oz (92.6 kg)    Physical Exam: GEN: alert, cooperative, and in NAD HENT: atraumatic, normocephalic EYES: anicteric sclera  CV: hemodynamically stable RESP: breathing comfortably on room air EXT: warm and well perfused PSYCH: normal behavior and appropriate affect  Assessment & Plan:   Assessment & Plan   Shawnte was seen today for seizures and adhd.  Diagnoses and all orders for this visit:  Snoring STOPBANG score 3/4. Will rule out sleep apnea as cause of sleep issues. Discuss more in depth at physical. -     Home sleep test  Encounter to establish care Reviewed patient record including history, medications, problem list. Will schedule for physical.  Anxious mood In therapy, working well. Declines pharmacologic therapy at this time. She is concerned about ADHD, provided education and offered future testing if needed.   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 Plan to follow up consultant notes. No medication changes needed at this time.  Follow up plan: Return in about 4 weeks (around 08/28/2023) for physical w Calvin Chura.  Hewitt Garner Howell Pringle, MD

## 2023-07-31 NOTE — Patient Instructions (Signed)
For the ADHD: ADDITUDE WEBSITE  SLEEP STUDY FOR SLEEP APNEA TESTING

## 2023-08-05 DIAGNOSIS — G40309 Generalized idiopathic epilepsy and epileptic syndromes, not intractable, without status epilepticus: Secondary | ICD-10-CM | POA: Diagnosis not present

## 2023-08-05 DIAGNOSIS — Z7689 Persons encountering health services in other specified circumstances: Secondary | ICD-10-CM | POA: Diagnosis not present

## 2023-08-05 DIAGNOSIS — Z79899 Other long term (current) drug therapy: Secondary | ICD-10-CM | POA: Diagnosis not present

## 2023-08-05 DIAGNOSIS — G40A09 Absence epileptic syndrome, not intractable, without status epilepticus: Secondary | ICD-10-CM | POA: Diagnosis not present

## 2023-08-07 DIAGNOSIS — Z30433 Encounter for removal and reinsertion of intrauterine contraceptive device: Secondary | ICD-10-CM | POA: Diagnosis not present

## 2023-08-07 DIAGNOSIS — T8332XD Displacement of intrauterine contraceptive device, subsequent encounter: Secondary | ICD-10-CM | POA: Diagnosis not present

## 2023-08-28 ENCOUNTER — Ambulatory Visit (INDEPENDENT_AMBULATORY_CARE_PROVIDER_SITE_OTHER): Payer: Medicaid Other | Admitting: Pediatrics

## 2023-08-28 ENCOUNTER — Encounter: Payer: Self-pay | Admitting: Pediatrics

## 2023-08-28 VITALS — BP 110/72 | HR 79 | Temp 99.1°F | Ht 64.0 in | Wt 200.8 lb

## 2023-08-28 DIAGNOSIS — Z6834 Body mass index (BMI) 34.0-34.9, adult: Secondary | ICD-10-CM | POA: Diagnosis not present

## 2023-08-28 DIAGNOSIS — Z114 Encounter for screening for human immunodeficiency virus [HIV]: Secondary | ICD-10-CM | POA: Diagnosis not present

## 2023-08-28 DIAGNOSIS — Z1330 Encounter for screening examination for mental health and behavioral disorders, unspecified: Secondary | ICD-10-CM

## 2023-08-28 DIAGNOSIS — Z1159 Encounter for screening for other viral diseases: Secondary | ICD-10-CM | POA: Diagnosis not present

## 2023-08-28 DIAGNOSIS — Z13220 Encounter for screening for lipoid disorders: Secondary | ICD-10-CM | POA: Diagnosis not present

## 2023-08-28 DIAGNOSIS — Z0000 Encounter for general adult medical examination without abnormal findings: Secondary | ICD-10-CM | POA: Diagnosis not present

## 2023-08-28 NOTE — Patient Instructions (Signed)
Dentist: [email or web address]

## 2023-08-28 NOTE — Progress Notes (Signed)
BP 110/72   Pulse 79   Temp 99.1 F (37.3 C) (Oral)   Ht 5\' 4"  (1.626 m)   Wt 200 lb 12.8 oz (91.1 kg)   LMP 08/10/2023 (Exact Date)   SpO2 100%   BMI 34.47 kg/m    Annual Physical Exam - Female  Subjective:   CC: Annual Exam   Gail Hill is a 22 y.o. female patient here for a preventative health maintenance exam and has no acute complaints.  Health Habits: DIET:  sporadic eating  eats 1-2 times a day, intermittent restriction EXERCISE: none  DENTAL EXAM: Due EYE EXAM: Up to Date                       Relevant Gynecologic History LMP: Patient's last menstrual period was 08/10/2023 (exact date).  Menstrual Status: premenopausal, Flow regular every month without intermenstrual spotting PAP History:  Result Date Procedure Results Follow-ups Next Due  06/03/2022 Cytology - PAP Adequacy: Satisfactory for evaluation; transformation zone component ABSENT. Diagnosis: - Negative for intraepithelial lesion or malignancy (NILM)      History abnormal PAP: No  Sexual activity: not currently, long distance boyfriend. Has IUD in place. Family history breast, ovarian cancer: No Domestic Violence Screen, feels safe at home: Yes  family history includes ADD / ADHD in her mother; Anemia in her mother; Anxiety disorder in her mother; Asthma in her sister; Cataracts in her paternal grandmother; Dementia in her paternal grandmother; Depression in her father and mother; Diabetes in her maternal grandfather and maternal grandmother; Fibromyalgia in her mother; Glucose-6-phos deficiency in her father; Heart disease in her paternal grandfather; Hypertension in her maternal grandfather, maternal grandmother, and paternal grandfather; Mitral valve prolapse in her maternal grandmother; Post-traumatic stress disorder in her father; Seizures in her maternal grandmother and mother; Stroke in her maternal grandmother and paternal grandfather.  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 Follow up: has not been working for 2 months, she is applying for unemployment. Has support from parent.  Depression Screening:     08/28/2023    8:12 AM 07/31/2023    2:56 PM 06/19/2023    4:44 PM 12/06/2021    3:00 PM  Depression screen PHQ 2/9  Decreased Interest 2 2 0 0  Down, Depressed, Hopeless 0 0 0 1  PHQ - 2 Score 2 2 0 1  Altered sleeping 3 2 0 1  Tired, decreased energy 2 3 0 1  Change in appetite 2 3 0 0  Feeling bad or failure about yourself  1 0 0 0  Trouble concentrating 0 3 0 1  Moving slowly or fidgety/restless 0 0 0 1  Suicidal thoughts 0 0 0 0  PHQ-9 Score 10 13 0 5  Difficult doing work/chores Very difficult Extremely dIfficult Not difficult at all Somewhat difficult       08/28/2023    8:12 AM 07/31/2023    2:57 PM 06/19/2023    4:44 PM 12/06/2021    3:01 PM  GAD 7 : Generalized Anxiety Score  Nervous, Anxious, on  Edge 1 2 0 0  Control/stop worrying 0 2 0 0  Worry too much - different things 2 2 0 0  Trouble relaxing 0 2 0 0  Restless 1 0 0 0  Easily annoyed or irritable 0 2 0 1  Afraid - awful might happen 0 0 0 0  Total GAD 7 Score 4 10 0 1  Anxiety Difficulty Somewhat difficult Very difficult Not difficult at all Somewhat difficult    Mental Health Plan:  has weekly therapy, declines pharmacologic treatment at this time  Self Management Goals  Goals   None     Health Maintenance Colon Cancer Screening : Not applicable Mammogram : Not applicable DXA scan : Not applicable Immunizations : up to date and documented  Review of Systems See HPI for relevant ROS.  Outpatient Medications Prior to Visit  Medication Sig Dispense  Refill   cholecalciferol (VITAMIN D3) 25 MCG (1000 UNIT) tablet Take 1,000 Units by mouth daily.     ferrous sulfate 325 (65 FE) MG tablet Take 1 tablet (325 mg total) by mouth daily with breakfast. 90 tablet 1   lamoTRIgine 100 MG TBDP Take 1 tablet by mouth 2 (two) times daily. neurology     levonorgestrel (MIRENA) 20 MCG/DAY IUD 1 each by Intrauterine route once.     No facility-administered medications prior to visit.     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  Physical Exam Constitutional:      Appearance: Normal appearance.  HENT:     Head: Normocephalic and atraumatic.  Eyes:     Pupils: Pupils are equal, round, and reactive to light.  Cardiovascular:     Rate and Rhythm: Normal rate and regular rhythm.     Pulses: Normal pulses.     Heart sounds: Normal heart sounds.  Pulmonary:     Effort: Pulmonary effort is normal.     Breath sounds: Normal breath sounds.  Abdominal:     General: Abdomen is flat.     Palpations: Abdomen is soft.  Musculoskeletal:        General: Normal range of motion.     Cervical back: Normal range of motion.  Skin:    General: Skin is warm and dry.     Capillary Refill: Capillary refill takes less than 2 seconds.  Neurological:     General: No focal deficit present.     Mental Status: She is alert. Mental status is at baseline.  Psychiatric:        Mood and Affect: Mood normal.        Behavior: Behavior normal.     Assessment and Plan:   Annual physical exam Discussed lifestyle modifications and goals including plant based eating styles (such as: Mediterranean eating style), regular exercise (at least 150 min of moderate-intensity aerobic  exercise per week), get adequate sleep, and continue working with PCP towards meeting health goals to ensure healthy aging. She made goals to walk around neighborhood and have more regular eating schedules.  -     CBC with Differential/Platelet -     TSH  BMI 34.0-34.9,adult Discussed healthy  at every size. Interested in losing some weight. Baseline panel below. Will return in 3 months. -     Comprehensive metabolic panel -     Hemoglobin A1c  Encounter for screening for HIV -     HIV Antibody (routine testing w rflx)  Encounter for hepatitis C screening test for low risk patient -     Hepatitis C antibody  Encounter for behavioral health screening As part of their intake evaluation, the patient was screened for depression, anxiety.  PHQ9 SCORE 10, GAD7 SCORE 4. Screening results positive for tested conditions. Follows with therapist weekly, no safety concerns.  Lipid screening -     Lipid panel  This plan was discussed with the patient and questions were answered. There were no further concerns.  Follow up as indicated, or sooner should any new problems arise, if conditions worsen, or if they are otherwise concerned.   See patient instructions for additional information.  Rodrickus Min Howell Pringle, MD  Family Medicine      Future Appointments  Date Time Provider Department Center  11/28/2023  8:00 AM Jackolyn Confer, MD [facility]

## 2023-08-29 LAB — CBC WITH DIFFERENTIAL/PLATELET
Basophils Absolute: 0 10*3/uL (ref 0.0–0.2)
Basos: 1 %
EOS (ABSOLUTE): 0.1 10*3/uL (ref 0.0–0.4)
Eos: 1 %
Hematocrit: 37.2 % (ref 34.0–46.6)
Hemoglobin: 11.7 g/dL (ref 11.1–15.9)
Immature Grans (Abs): 0 10*3/uL (ref 0.0–0.1)
Immature Granulocytes: 0 %
Lymphocytes Absolute: 2.9 10*3/uL (ref 0.7–3.1)
Lymphs: 46 %
MCH: 27.1 pg (ref 26.6–33.0)
MCHC: 31.5 g/dL (ref 31.5–35.7)
MCV: 86 fL (ref 79–97)
Monocytes Absolute: 0.4 10*3/uL (ref 0.1–0.9)
Monocytes: 7 %
Neutrophils Absolute: 2.8 10*3/uL (ref 1.4–7.0)
Neutrophils: 45 %
Platelets: 406 10*3/uL (ref 150–450)
RBC: 4.32 x10E6/uL (ref 3.77–5.28)
RDW: 13.6 % (ref 11.7–15.4)
WBC: 6.3 10*3/uL (ref 3.4–10.8)

## 2023-08-29 LAB — COMPREHENSIVE METABOLIC PANEL
ALT: 13 [IU]/L (ref 0–32)
AST: 13 [IU]/L (ref 0–40)
Albumin: 4.3 g/dL (ref 4.0–5.0)
Alkaline Phosphatase: 61 [IU]/L (ref 44–121)
BUN/Creatinine Ratio: 14 (ref 9–23)
BUN: 11 mg/dL (ref 6–20)
Bilirubin Total: 0.5 mg/dL (ref 0.0–1.2)
CO2: 23 mmol/L (ref 20–29)
Calcium: 10 mg/dL (ref 8.7–10.2)
Chloride: 102 mmol/L (ref 96–106)
Creatinine, Ser: 0.8 mg/dL (ref 0.57–1.00)
Globulin, Total: 2.6 g/dL (ref 1.5–4.5)
Glucose: 82 mg/dL (ref 70–99)
Potassium: 4.1 mmol/L (ref 3.5–5.2)
Sodium: 139 mmol/L (ref 134–144)
Total Protein: 6.9 g/dL (ref 6.0–8.5)
eGFR: 107 mL/min/{1.73_m2} (ref >59)

## 2023-08-29 LAB — HEMOGLOBIN A1C
Est. average glucose Bld gHb Est-mCnc: 105 mg/dL
Hgb A1c MFr Bld: 5.3 % (ref 4.8–5.6)

## 2023-08-29 LAB — LIPID PANEL
Chol/HDL Ratio: 3.9 {ratio} (ref 0.0–4.4)
Cholesterol, Total: 150 mg/dL (ref 100–199)
HDL: 38 mg/dL — ABNORMAL LOW (ref >39)
LDL Chol Calc (NIH): 99 mg/dL (ref 0–99)
Triglycerides: 63 mg/dL (ref 0–149)
VLDL Cholesterol Cal: 13 mg/dL (ref 5–40)

## 2023-08-29 LAB — HEPATITIS C ANTIBODY: Hep C Virus Ab: NONREACTIVE

## 2023-08-29 LAB — TSH: TSH: 1.07 u[IU]/mL (ref 0.450–4.500)

## 2023-08-29 LAB — HIV ANTIBODY (ROUTINE TESTING W REFLEX): HIV Screen 4th Generation wRfx: NONREACTIVE

## 2023-09-03 DIAGNOSIS — Z30431 Encounter for routine checking of intrauterine contraceptive device: Secondary | ICD-10-CM | POA: Diagnosis not present

## 2023-09-04 DIAGNOSIS — F411 Generalized anxiety disorder: Secondary | ICD-10-CM | POA: Diagnosis not present

## 2023-09-04 DIAGNOSIS — F338 Other recurrent depressive disorders: Secondary | ICD-10-CM | POA: Diagnosis not present

## 2023-10-01 DIAGNOSIS — M5412 Radiculopathy, cervical region: Secondary | ICD-10-CM

## 2023-10-01 HISTORY — DX: Radiculopathy, cervical region: M54.12

## 2023-10-02 DIAGNOSIS — F411 Generalized anxiety disorder: Secondary | ICD-10-CM | POA: Diagnosis not present

## 2023-10-02 DIAGNOSIS — M5412 Radiculopathy, cervical region: Secondary | ICD-10-CM | POA: Diagnosis not present

## 2023-10-02 DIAGNOSIS — F338 Other recurrent depressive disorders: Secondary | ICD-10-CM | POA: Diagnosis not present

## 2023-10-02 DIAGNOSIS — M542 Cervicalgia: Secondary | ICD-10-CM | POA: Diagnosis not present

## 2023-10-02 HISTORY — DX: Cervicalgia: M54.2

## 2023-10-22 DIAGNOSIS — M5412 Radiculopathy, cervical region: Secondary | ICD-10-CM | POA: Diagnosis not present

## 2023-10-22 DIAGNOSIS — M542 Cervicalgia: Secondary | ICD-10-CM | POA: Diagnosis not present

## 2023-10-29 DIAGNOSIS — M542 Cervicalgia: Secondary | ICD-10-CM | POA: Diagnosis not present

## 2023-10-29 DIAGNOSIS — M5412 Radiculopathy, cervical region: Secondary | ICD-10-CM | POA: Diagnosis not present

## 2023-11-05 DIAGNOSIS — M5412 Radiculopathy, cervical region: Secondary | ICD-10-CM | POA: Diagnosis not present

## 2023-11-05 DIAGNOSIS — M542 Cervicalgia: Secondary | ICD-10-CM | POA: Diagnosis not present

## 2023-11-20 DIAGNOSIS — M5412 Radiculopathy, cervical region: Secondary | ICD-10-CM | POA: Diagnosis not present

## 2023-11-21 DIAGNOSIS — F338 Other recurrent depressive disorders: Secondary | ICD-10-CM | POA: Diagnosis not present

## 2023-11-21 DIAGNOSIS — F411 Generalized anxiety disorder: Secondary | ICD-10-CM | POA: Diagnosis not present

## 2023-11-26 DIAGNOSIS — M5412 Radiculopathy, cervical region: Secondary | ICD-10-CM | POA: Diagnosis not present

## 2023-11-26 DIAGNOSIS — M542 Cervicalgia: Secondary | ICD-10-CM | POA: Diagnosis not present

## 2023-11-28 ENCOUNTER — Ambulatory Visit: Payer: Medicaid Other | Admitting: Pediatrics

## 2023-12-03 DIAGNOSIS — M5412 Radiculopathy, cervical region: Secondary | ICD-10-CM | POA: Diagnosis not present

## 2023-12-03 DIAGNOSIS — M542 Cervicalgia: Secondary | ICD-10-CM | POA: Diagnosis not present

## 2023-12-04 ENCOUNTER — Ambulatory Visit (INDEPENDENT_AMBULATORY_CARE_PROVIDER_SITE_OTHER): Payer: Medicaid Other | Admitting: Pediatrics

## 2023-12-04 ENCOUNTER — Encounter: Payer: Self-pay | Admitting: Pediatrics

## 2023-12-04 VITALS — BP 139/78 | HR 95 | Temp 98.7°F | Resp 17 | Wt 201.2 lb

## 2023-12-04 DIAGNOSIS — G4700 Insomnia, unspecified: Secondary | ICD-10-CM | POA: Diagnosis not present

## 2023-12-04 DIAGNOSIS — R41840 Attention and concentration deficit: Secondary | ICD-10-CM | POA: Diagnosis not present

## 2023-12-04 DIAGNOSIS — Z1330 Encounter for screening examination for mental health and behavioral disorders, unspecified: Secondary | ICD-10-CM

## 2023-12-04 DIAGNOSIS — F338 Other recurrent depressive disorders: Secondary | ICD-10-CM | POA: Diagnosis not present

## 2023-12-04 DIAGNOSIS — F411 Generalized anxiety disorder: Secondary | ICD-10-CM | POA: Diagnosis not present

## 2023-12-04 MED ORDER — TRAZODONE HCL 50 MG PO TABS
25.0000 mg | ORAL_TABLET | Freq: Every evening | ORAL | 3 refills | Status: AC | PRN
Start: 2023-12-04 — End: ?

## 2023-12-04 NOTE — Progress Notes (Signed)
Office Visit  BP 139/78 (BP Location: Left Arm, Patient Position: Sitting, Cuff Size: Large)   Pulse 95   Temp 98.7 F (37.1 C) (Oral)   Resp 17   Wt 201 lb 3.2 oz (91.3 kg)   LMP  (LMP Unknown)   SpO2 99%   BMI 34.54 kg/m    Subjective:    Patient ID: Gail Hill, female    DOB: 08-14-2001, 23 y.o.   MRN: [id number]  HPI: Gail Hill is a 23 y.o. female  Chief Complaint  Patient presents with   Follow-up    Would like to address sleep concerns. Can sleep in excess of 5 hours with naps. Sleep can be very fragmented.    ADHD    Would like to be tested. Flowsheet ASRS completed    Discussed the use of AI scribe software for clinical note transcription with the patient, who gave verbal consent to proceed.  History of Present Illness   The patient, with a history of seizures, presents with significant sleep disturbances and suspected ADHD. She reports a recent change in sleep patterns, characterized by difficulty falling asleep and maintaining sleep. Despite attempts to regulate her sleep schedule, she consistently wakes up around 3 AM, regardless of the time she falls asleep. This disrupted sleep pattern leaves her feeling tired upon waking and throughout the day.  The patient also reports a lack of appetite in the morning, often forcing herself to eat despite not feeling hungry. She has made efforts to improve her diet, incorporating oatmeal and cereal into her morning routine.  The patient also expresses concerns about her productivity and ability to complete tasks, suspecting that she may have ADHD. She reports a family history of ADHD and expresses a desire to undergo an evaluation. 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 She reports that her disrupted sleep and potential ADHD symptoms are impacting her ability to complete her work and daily tasks.      Height:    Weight: 201 lb 3.2 oz (91.3 kg) BMI: Body mass  index is 34.54 kg/m.  Neck circumference: 40.5 cm  Do you snore loudly (louder than talking or loud enough to be heard through closed doors)?  unsure Do you often feel tired, fatigued, or sleepy during daytime?  Yes Has anyone observed you stop breathing during your sleep?  No Do you have or are you being treated for high blood pressure?  No BMI more than 35 kg/m2?  No Age over 50 years?  No Neck circumference greater than 40 cm?  Yes Female gender?  No  Total "Yes" Answers: 2-3, border line     12/05/2023    8:00 AM  Results of the Epworth flowsheet  Sitting and reading 3  Watching TV 3  Sitting, inactive in a public place (e.g. a theatre or a meeting) 2  As a passenger in a car for an hour without a break 2  Lying down to rest in the afternoon when circumstances permit 2  Sitting and talking to someone 2  Sitting quietly after a lunch without alcohol 3  In a car, while stopped for a few minutes in traffic 2  Total score 19   Relevant past medical, surgical, family and social history reviewed and updated as indicated. Interim medical history since our last visit reviewed. Allergies and medications reviewed and updated.  ROS per HPI unless specifically indicated above     Objective:    BP 139/78 (  BP Location: Left Arm, Patient Position: Sitting, Cuff Size: Large)   Pulse 95   Temp 98.7 F (37.1 C) (Oral)   Resp 17   Wt 201 lb 3.2 oz (91.3 kg)   LMP  (LMP Unknown)   SpO2 99%   BMI 34.54 kg/m   Wt Readings from Last 3 Encounters:  12/04/23 201 lb 3.2 oz (91.3 kg)  08/28/23 200 lb 12.8 oz (91.1 kg)  07/31/23 207 lb 6.4 oz (94.1 kg)     Physical Exam Constitutional:      Appearance: Normal appearance.  Pulmonary:     Effort: Pulmonary effort is normal.  Musculoskeletal:        General: Normal range of motion.  Skin:    Comments: Normal skin color  Neurological:     General: No focal deficit present.     Mental Status: She is alert. Mental status is at  baseline.  Psychiatric:        Mood and Affect: Mood normal.        Behavior: Behavior normal.        Thought Content: Thought content normal.     Adult ADHD Self Report Scale (most recent)     Adult ADHD Self-Report Scale (ASRS-v1.1) Symptom Checklist - 12/04/23 1000       Part A   1. How often do you have trouble wrapping up the final details of a project, once the challenging parts have been done? Often  2. How often do you have difficulty getting things done in order when you have to do a task that requires organization? Often    3. How often do you have problems remembering appointments or obligations? Very Often  4. When you have a task that requires a lot of thought, how often do you avoid or delay getting started? Sometimes    5. How often do you fidget or squirm with your hands or feet when you have to sit down for a long time? Very Often  6. How often do you feel overly active and compelled to do things, like you were driven by a motor? Rarely      Part B   7. How often do you make careless mistakes when you have to work on a boring or difficult project? Sometimes  8. How often do you have difficulty keeping your attention when you are doing boring or repetitive work? Very Often    9. How often do you have difficulty concentrating on what people say to you, even when they are speaking to you directly? Often  10. How often do you misplace or have difficulty finding things at home or at work? Often    11. How often are you distracted by activity or noise around you? Sometimes  12. How often do you leave your seat in meetings or other situations in which you are expected to remain seated? Rarely    13. How often do you feel restless or fidgety? Sometimes  14. How often do you have difficulty unwinding and relaxing when you have time to yourself? Sometimes    15. How often do you find yourself talking too much when you are in social situations? Rarely  16. When you are in a conversation,  how often do you find yourself finishing the sentences of the people you are talking to, before they can finish them themselves? Sometimes    17. How often do you have difficulty waiting your turn in situations when turn taking is required?  Rarely  18. How often do you interrupt others when they are busy? Often      Comment   How old were you when these problems first began to occur? 14                   12/04/2023   11:03 AM 08/28/2023    8:12 AM 07/31/2023    2:56 PM 06/19/2023    4:44 PM 12/06/2021    3:00 PM  Depression screen PHQ 2/9  Decreased Interest 1 2 2  0 0  Down, Depressed, Hopeless 0 0 0 0 1  PHQ - 2 Score 1 2 2  0 1  Altered sleeping 3 3 2  0 1  Tired, decreased energy 3 2 3  0 1  Change in appetite 1 2 3  0 0  Feeling bad or failure about yourself  1 1 0 0 0  Trouble concentrating 2 0 3 0 1  Moving slowly or fidgety/restless 0 0 0 0 1  Suicidal thoughts 0 0 0 0 0  PHQ-9 Score 11 10 13  0 5  Difficult doing work/chores Somewhat difficult Very difficult Extremely dIfficult Not difficult at all Somewhat difficult       12/04/2023   11:04 AM 08/28/2023    8:12 AM 07/31/2023    2:57 PM 06/19/2023    4:44 PM  GAD 7 : Generalized Anxiety Score  Nervous, Anxious, on Edge 0 1 2 0  Control/stop worrying 0 0 2 0  Worry too much - different things 1 2 2  0  Trouble relaxing 0 0 2 0  Restless 0 1 0 0  Easily annoyed or irritable 1 0 2 0  Afraid - awful might happen 0 0 0 0  Total GAD 7 Score 2 4 10  0  Anxiety Difficulty Somewhat difficult Somewhat difficult Very difficult Not difficult at all       Assessment & Plan:  Assessment & Plan   Insomnia, unspecified type Difficulty falling asleep and staying asleep. Wakes up multiple times during the night. Reports feeling tired upon waking. No witnessed apneas or snoring reported. Family history of sleep apnea. -Order at-home sleep study to evaluate for sleep apnea. -Prescribe Trazodone 50mg  as needed for sleep. Instruct to  start with half a tablet and increase to a full tablet if needed. -     traZODone HCl; Take 0.5-1 tablets (25-50 mg total) by mouth at bedtime as needed for sleep.  Dispense: 30 tablet; Refill: 3  Attention and concentration deficit Reports difficulty focusing and completing tasks. Family history of ADHD. -Schedule ADHD evaluation in the next 1-2 weeks. Discuss safe medication options considering history of seizures.   Encounter for behavioral health screening As part of their intake evaluation, the patient was screened for depression, anxiety.  PHQ9 SCORE 11, GAD7 SCORE 2. Screening results positive for tested conditions. Patient in therapy and prefers to manage like this vs medications. CTM.  Follow up plan: Return in about 1 week (around 12/11/2023) for ADHD evaluation.  Goldia Ligman Howell Pringle, MD   Approximately 30 minutes spent on patient encounter today including assessment, counseling, diagnosing, treatment plan development, and charting.

## 2023-12-04 NOTE — Patient Instructions (Signed)
For sleep, use trazodone 25-50mg  as needed (can break in half) Will get you set up for a sleep study as well

## 2023-12-05 ENCOUNTER — Encounter: Payer: Self-pay | Admitting: Pediatrics

## 2023-12-10 DIAGNOSIS — M542 Cervicalgia: Secondary | ICD-10-CM | POA: Diagnosis not present

## 2023-12-10 DIAGNOSIS — M5412 Radiculopathy, cervical region: Secondary | ICD-10-CM | POA: Diagnosis not present

## 2023-12-11 ENCOUNTER — Encounter: Payer: Self-pay | Admitting: Pediatrics

## 2023-12-11 ENCOUNTER — Telehealth: Payer: Medicaid Other | Admitting: Pediatrics

## 2023-12-11 DIAGNOSIS — Z1330 Encounter for screening examination for mental health and behavioral disorders, unspecified: Secondary | ICD-10-CM

## 2023-12-11 DIAGNOSIS — G40A09 Absence epileptic syndrome, not intractable, without status epilepticus: Secondary | ICD-10-CM | POA: Diagnosis not present

## 2023-12-11 DIAGNOSIS — F902 Attention-deficit hyperactivity disorder, combined type: Secondary | ICD-10-CM | POA: Diagnosis not present

## 2023-12-11 NOTE — Assessment & Plan Note (Signed)
Patient presenting with long history of concentration and attention concerns since early childhood. She was diagnosed with absence seizure at that time and has been well controlled but attention and concentration deficits have remained and are impacting her in adult life. Completed extensive evaluation today, patient meeting criteria for combined ADHD. We discussed behavioral modifications as well as expectations from pharmacologic ADHD treatment. Discussed side effects from stimulant medications as well as reviewed nonstimulant options. Patient open to either, priority is to avoid lamictal drug interaction. I recommend starting with either adderall XR or concerta. Mom has ADHD and she will check to see what she has used since it has worked well for her. Of note, her days are very variable right now and she is unemployed so will be very intentional on which days she will take the medication. Once medication started, will follow up in 2 weeks.

## 2023-12-11 NOTE — Assessment & Plan Note (Signed)
Seizure free for over a year. Currently on lamictal 100mg  BID. Emphasized importance of regular sleep and routine with starting ADHD treatment. No drug drug interaction known from concerta or XR adderall. Follows with neurology, next appointment is in September.

## 2023-12-11 NOTE — Progress Notes (Signed)
Telehealth Visit  I connected with  Gail Hill on 12/11/23 by a video enabled telemedicine application and verified that I am speaking with the correct person using two identifiers.   I discussed the limitations of evaluation and management by telemedicine. The patient expressed understanding and agreed to proceed.  Subjective:    Patient ID: Gail Hill, female    DOB: 05-29-2001, 23 y.o.   MRN: [id number]  HPI: Gail Hill is a 23 y.o. female  Chief Complaint  Patient presents with   ADHD   Insomnia    Feels this is improving. 9 hours of sleep now versus the 4 prior to starting medication.     SUBJECTIVE  Patient presents today with a complaint of poor concentration/focus. She reports that she's had attention issues for along time.  She reports that symptoms began 12 years ago.  She has not been diagnosed with ADHD previously. 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 Attributed to her seizures. She reports that her problem with concentration affects the following functional areas: noticed social settings, chores at home and at work. She notices the symptoms. She endorses the following symptoms:   Hyperactivity(6/9 for children, 5/9 for adults)  [x]  Often fidgets with or taps hands or feet or squirms in seat  []  Difficulty remaining seated when sitting is required (eg, at school, work, Catering manager)  [x]  Feelings of restlessness (in adolescents/adults) or inappropriate running around or climbing in younger children  []  Difficulty playing or doing leisure activities quietly  [x]  Difficult to keep up with, seeming to always be "on the go" (may be experienced by others as being restless or difficult to keep up with)  []  Excessive talking  []  Difficulty waiting turns (while in line for example)  [x]  Blurting out answers too quickly (completes people' sentences; cannot wait for turn in conversation)  [x]  Interruption or intrusion of others (may intrude into or take over what others are  doing)  Inattention (6/9 for children, 5/9 for adults) [x]  Failure to provide close attention to detail, careless mistakes [x]  Difficulty maintaining attention in play, school, or home activities (difficulty remaining focused during lectures, conversations, or lengthy reading) [x]  Seems not to listen, even when directly addressed [x]  Fails to follow through  (ex: homework, chores, workplace duties, misses deadlines) [x]  Difficulty organizing tasks, activities, and belongings (difficulty managing sequential tasks) []  Avoids tasks that require consistent mental effort (preparing reports, completing forms, reviewing lengthy papers) [x]  Loses objects required for tasks or activities (eg, school books, sports equipment, phones, etc) [x]  Easily distracted by irrelevant stimuli (maybe unrelated thoughts) [x]  Forgetfulness in routine activities (eg, homework, chores, paying bills, keeping appointments, etc)  In regards to mood the patient reports: Mood is good and stable Therapy every other week, has asked about ADHD underlying diagnosis  In regards to anxiety the patient reports: Does have some anxiety in the car, related to initial presentation of seizure. She is still unable to drive due to seizures.   Over a year seizure free, following with neurology.  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  Seizures medications  previously tried: Medications previously tried: Ethosuximide, keppra, topamax, zonegran  Currently on lamictal 100mg  BID, yearly follow up with neurology right now  Relevant past medical, surgical, family and social history reviewed and updated as indicated. Interim medical history since our last visit reviewed. Allergies and medications reviewed and updated.  ROS per HPI unless specifically indicated above     Objective:    LMP  (LMP Unknown)   Wt Readings from Last 3 Encounters:  12/04/23 201 lb 3.2 oz (91.3 kg)  08/28/23 200 lb 12.8 oz (91.1 kg)  07/31/23 207 lb 6.4 oz (94.1 kg)     Physical Exam Constitutional:      General: She is not in acute distress.    Appearance: Normal appearance.  Neurological:     General: No focal deficit present.     Mental Status: She is alert. Mental status is at baseline.      LIMITED EXAM GIVEN VIDEO VISIT     12/11/2023   12:57 PM 12/04/2023   11:03 AM 08/28/2023    8:12 AM 07/31/2023    2:56 PM 06/19/2023    4:44 PM  Depression screen PHQ 2/9  Decreased Interest 1 1 2 2  0  Down, Depressed, Hopeless 0 0 0 0 0  PHQ - 2 Score 1 1 2 2  0  Altered sleeping 0 3 3 2  0  Tired, decreased energy 0 3 2 3  0  Change in appetite 0 1 2 3  0  Feeling bad or failure about yourself  0 1 1 0 0  Trouble concentrating 2 2 0 3 0  Moving slowly or fidgety/restless 1 0 0 0 0  Suicidal thoughts 0 0 0 0 0  PHQ-9 Score 4 11 10 13  0  Difficult doing work/chores Not difficult at all Somewhat difficult Very difficult Extremely dIfficult Not difficult at all       12/11/2023   12:59 PM 12/04/2023   11:04 AM 08/28/2023    8:12 AM 07/31/2023    2:57 PM  GAD 7 : Generalized Anxiety Score  Nervous, Anxious, on Edge 1 0 1 2  Control/stop worrying 0 0 0 2  Worry too much - different things 1 1 2 2   Trouble relaxing 0 0 0 2  Restless 1 0 1 0  Easily annoyed or irritable 0 1 0 2  Afraid - awful might happen 0 0 0 0  Total GAD 7 Score 3 2 4 10   Anxiety  Difficulty Not difficult at all Somewhat difficult Somewhat difficult Very difficult       Assessment & Plan:  Assessment & Plan   Attention deficit hyperactivity disorder (ADHD), combined type, mild Assessment & Plan: Patient presenting with long history of concentration and attention concerns since early childhood. She was diagnosed with absence seizure at that time and has been well controlled but attention and concentration deficits have remained and are impacting her in adult life. Completed extensive evaluation today, patient meeting criteria for combined ADHD. We discussed behavioral modifications as well as expectations from pharmacologic ADHD treatment. Discussed side effects from stimulant medications as well as reviewed nonstimulant options. Patient open to either, priority is to avoid lamictal drug interaction. I recommend starting with either adderall XR or concerta. Mom has ADHD and she will check to see what she has used since it has worked well for  her. Of note, her days are very variable right now and she is unemployed so will be very intentional on which days she will take the medication. Once medication started, will follow up in 2 weeks.    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 Currently on lamictal 100mg  BID. Emphasized importance of regular sleep and routine with starting ADHD treatment. No drug drug interaction known from concerta or XR adderall. Follows with neurology, next appointment is in September.    Encounter for behavioral health screening As part of their intake evaluation, the patient was screened for depression, anxiety.  PHQ9 SCORE 4, GAD7 SCORE 3. Screening results negative for tested conditions. See plan under problem/diagnosis above.   Follow up plan: Return in about 2 weeks (around 12/25/2023) for ADHD.  Lourie Retz P Saquan Furtick, MD   This visit was completed via video visit through  MyChart due to the restrictions of the COVID-19 pandemic. All issues as above were discussed and addressed. Physical exam was done as above through visual confirmation on video through MyChart. If it was felt that the patient should be evaluated in the office, they were directed there. The patient verbally consented to this visit."} Location of the patient: home Location of the provider: work Those involved with this call:  Provider: Modena Nunnery, MD CMA: Natalia Leatherwood Lumpkins Time spent on call:  30 minutes with patient face to face via video conference. More than 50% of this time was spent in counseling and coordination of care. 10 minutes total spent in review of patient's record and preparation of their chart. Total time spent on this encounter: 40 minutes.

## 2023-12-11 NOTE — Patient Instructions (Signed)
Will message you with pharmacy recommendations Please check which medication your mom uses.  Please be sure to check out the [employer] website for behavioral modifications that can help you manage you ADHD.   We will see each other two weeks after starting medication.

## 2023-12-12 ENCOUNTER — Other Ambulatory Visit: Payer: Self-pay | Admitting: Pediatrics

## 2023-12-12 ENCOUNTER — Encounter: Payer: Self-pay | Admitting: Pediatrics

## 2023-12-12 DIAGNOSIS — F902 Attention-deficit hyperactivity disorder, combined type: Secondary | ICD-10-CM

## 2023-12-12 MED ORDER — METHYLPHENIDATE HCL ER (OSM) 18 MG PO TBCR
18.0000 mg | EXTENDED_RELEASE_TABLET | Freq: Every day | ORAL | 0 refills | Status: DC
Start: 2023-12-12 — End: 2024-01-06

## 2023-12-12 NOTE — Progress Notes (Signed)
Concerta sent for ADHD.  Gail Zylka Howell Pringle, MD

## 2023-12-16 NOTE — Telephone Encounter (Signed)
Could not leva message on machine made appt and sent reminder in the mail

## 2023-12-18 DIAGNOSIS — F411 Generalized anxiety disorder: Secondary | ICD-10-CM | POA: Diagnosis not present

## 2023-12-18 DIAGNOSIS — F338 Other recurrent depressive disorders: Secondary | ICD-10-CM | POA: Diagnosis not present

## 2024-01-01 DIAGNOSIS — F411 Generalized anxiety disorder: Secondary | ICD-10-CM | POA: Diagnosis not present

## 2024-01-01 DIAGNOSIS — F338 Other recurrent depressive disorders: Secondary | ICD-10-CM | POA: Diagnosis not present

## 2024-01-05 DIAGNOSIS — H5213 Myopia, bilateral: Secondary | ICD-10-CM | POA: Diagnosis not present

## 2024-01-06 ENCOUNTER — Ambulatory Visit (INDEPENDENT_AMBULATORY_CARE_PROVIDER_SITE_OTHER): Payer: Medicaid Other | Admitting: Pediatrics

## 2024-01-06 ENCOUNTER — Encounter: Payer: Self-pay | Admitting: Pediatrics

## 2024-01-06 VITALS — BP 106/73 | HR 67 | Temp 97.9°F | Ht 64.0 in | Wt 207.8 lb

## 2024-01-06 DIAGNOSIS — G4700 Insomnia, unspecified: Secondary | ICD-10-CM | POA: Insufficient documentation

## 2024-01-06 DIAGNOSIS — H5213 Myopia, bilateral: Secondary | ICD-10-CM | POA: Diagnosis not present

## 2024-01-06 DIAGNOSIS — G40A09 Absence epileptic syndrome, not intractable, without status epilepticus: Secondary | ICD-10-CM

## 2024-01-06 DIAGNOSIS — F5104 Psychophysiologic insomnia: Secondary | ICD-10-CM | POA: Diagnosis not present

## 2024-01-06 DIAGNOSIS — Z1330 Encounter for screening examination for mental health and behavioral disorders, unspecified: Secondary | ICD-10-CM

## 2024-01-06 DIAGNOSIS — F902 Attention-deficit hyperactivity disorder, combined type: Secondary | ICD-10-CM | POA: Diagnosis not present

## 2024-01-06 MED ORDER — METHYLPHENIDATE HCL ER (OSM) 18 MG PO TBCR
18.0000 mg | EXTENDED_RELEASE_TABLET | Freq: Every day | ORAL | 0 refills | Status: DC
Start: 2024-01-11 — End: 2024-03-24

## 2024-01-06 NOTE — Assessment & Plan Note (Signed)
Positive response to medication with improved focus and productivity. No significant side effects reported. Patient is taking medication breaks weekly. -Continue current dose of Concerta 18mg . -Check in at 6 weeks to assess if dose adjustment is needed. -Encourage continued use of [employer] app for task management.

## 2024-01-06 NOTE — Addendum Note (Signed)
Addended by: Jackolyn Confer on: 01/06/2024 04:47 PM   Modules accepted: Level of Service

## 2024-01-06 NOTE — Assessment & Plan Note (Signed)
No recent seizures reported.  -Continue current seizure management with neurology. Next visit in september.

## 2024-01-06 NOTE — Progress Notes (Signed)
Office Visit  BP 106/73 (BP Location: Left Arm, Patient Position: Sitting, Cuff Size: Normal)   Pulse 67   Temp 97.9 F (36.6 C) (Oral)   Ht 5\' 4"  (1.626 m)   Wt 207 lb 12.8 oz (94.3 kg)   LMP 12/14/2023 (Exact Date)   SpO2 100%   BMI 35.67 kg/m    Subjective:    Patient ID: Gail Hill, female    DOB: [birth year], 23 y.o.   MRN: [id number]  HPI: Gail Hill is a 23 y.o. female  Chief Complaint  Patient presents with   ADHD    Discussed the use of AI scribe software for clinical note transcription with the patient, who gave verbal consent to proceed.  History of Present Illness   Gail Hill is a 23 year old female with ADHD who presents for medication management and follow-up.  She finds her ADHD medication effective in helping her complete tasks. Initially, she experienced sleepiness when starting the medication, but this effect has subsided. She takes the medication mostly during the day to avoid interference with her sleep. Occasionally, she feels sleepy on days she does not take the medication, which she attributes to not needing it on those days. She is currently satisfied with the dosage and takes breaks from the medication weekly.  She is also taking trazodone for sleep, which she finds effective. If she misses a dose, she can still sleep but tends to wake up earlier than usual, around 3:00 to 6:00 AM. She is currently taking half a dose and has not experienced any side effects from the trazodone.  There have been no changes in her seizure activity, and she reports that everything is stable in this regard.  In terms of her social life, her relationship with her boyfriend is going well, and she recently celebrated Valentine's Day with him. She enjoys photography and recently took pictures at a senior citizen event. She is no longer in physical therapy but continues to stay active by walking regularly. She uses an app called [employer] to help manage her daily tasks and  habits, which she finds motivating and enjoyable.     Relevant past medical, surgical, family and social history reviewed and updated as indicated. Interim medical history since our last visit reviewed. Allergies and medications reviewed and updated.  ROS per HPI unless specifically indicated above     Objective:    BP 106/73 (BP Location: Left Arm, Patient Position: Sitting, Cuff Size: Normal)   Pulse 67   Temp 97.9 F (36.6 C) (Oral)   Ht 5\' 4"  (1.626 m)   Wt 207 lb 12.8 oz (94.3 kg)   LMP 12/14/2023 (Exact Date)   SpO2 100%   BMI 35.67 kg/m   Wt Readings from Last 3 Encounters:  01/06/24 207 lb 12.8 oz (94.3 kg)  12/04/23 201 lb 3.2 oz (91.3 kg)  08/28/23 200 lb 12.8 oz (91.1 kg)     Physical Exam Constitutional:      Appearance: Normal appearance.  Pulmonary:     Effort: Pulmonary effort is normal.  Musculoskeletal:        General: Normal range of motion.  Skin:    Comments: Normal skin color  Neurological:     General: No focal deficit present.     Mental Status: She is alert. Mental status is at baseline.  Psychiatric:        Mood and Affect: Mood normal.        Behavior: Behavior normal.  Thought Content: Thought content normal.         01/06/2024   11:25 AM 12/11/2023   12:57 PM 12/04/2023   11:03 AM 08/28/2023    8:12 AM 07/31/2023    2:56 PM  Depression screen PHQ 2/9  Decreased Interest 0 1 1 2 2   Down, Depressed, Hopeless 0 0 0 0 0  PHQ - 2 Score 0 1 1 2 2   Altered sleeping 1 0 3 3 2   Tired, decreased energy 1 0 3 2 3   Change in appetite 1 0 1 2 3   Feeling bad or failure about yourself  0 0 1 1 0  Trouble concentrating 0 2 2 0 3  Moving slowly or fidgety/restless 0 1 0 0 0  Suicidal thoughts 0 0 0 0 0  PHQ-9 Score 3 4 11 10 13   Difficult doing work/chores Somewhat difficult Not difficult at all Somewhat difficult Very difficult Extremely dIfficult       01/06/2024   11:25 AM 12/11/2023   12:59 PM 12/04/2023   11:04 AM 08/28/2023     8:12 AM  GAD 7 : Generalized Anxiety Score  Nervous, Anxious, on Edge 1 1 0 1  Control/stop worrying 0 0 0 0  Worry too much - different things 1 1 1 2   Trouble relaxing 0 0 0 0  Restless 0 1 0 1  Easily annoyed or irritable 0 0 1 0  Afraid - awful might happen 0 0 0 0  Total GAD 7 Score 2 3 2 4   Anxiety Difficulty Not difficult at all Not difficult at all Somewhat difficult Somewhat difficult       Assessment & Plan:  Assessment & Plan   Attention deficit hyperactivity disorder (ADHD), combined type, mild Assessment & Plan: Positive response to medication with improved focus and productivity. No significant side effects reported. Patient is taking medication breaks weekly. -Continue current dose of Concerta 18mg . -Check in at 6 weeks to assess if dose adjustment is needed. -Encourage continued use of [employer] app for task management.  Orders: -     Methylphenidate HCl ER (OSM); Take 1 tablet (18 mg total) by mouth daily.  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  -Continue current seizure management with neurology. Next visit in september.   Psychophysiological insomnia Assessment & Plan: Improved sleep pattern with Trazodone. Occasional early morning awakenings when dose is missed. -Continue Trazodone at current dose. -Consider increasing dose if sleep disturbances persist.   Encounter for behavioral health screening As part of their intake evaluation, the patient was screened for depression, anxiety.  PHQ9 SCORE 3, GAD7 SCORE 2. Screening results negative for tested conditions. Continue management of ADHD above. Continue work with therapy.     Follow up plan: Return in about 6 weeks (around 02/17/2024) for ADHD.  Jackolyn Confer, MD  Approximately 30 minutes spent on patient encounter today including assessment, counseling, diagnosing, treatment plan development, and  charting.

## 2024-01-06 NOTE — Assessment & Plan Note (Signed)
Improved sleep pattern with Trazodone. Occasional early morning awakenings when dose is missed. -Continue Trazodone at current dose. -Consider increasing dose if sleep disturbances persist.

## 2024-01-06 NOTE — Patient Instructions (Signed)
Continue at current dose  Check out the website [employer] for ADHD resources and support. One other helpful tool with ADHD are tomato timers (promodoro timers) when trying to accomplish tasks. Check out an example at this website: https://www.tomatotimers.com/

## 2024-01-15 DIAGNOSIS — F338 Other recurrent depressive disorders: Secondary | ICD-10-CM | POA: Diagnosis not present

## 2024-01-15 DIAGNOSIS — F411 Generalized anxiety disorder: Secondary | ICD-10-CM | POA: Diagnosis not present

## 2024-01-29 DIAGNOSIS — F338 Other recurrent depressive disorders: Secondary | ICD-10-CM | POA: Diagnosis not present

## 2024-01-29 DIAGNOSIS — F411 Generalized anxiety disorder: Secondary | ICD-10-CM | POA: Diagnosis not present

## 2024-02-12 DIAGNOSIS — F338 Other recurrent depressive disorders: Secondary | ICD-10-CM | POA: Diagnosis not present

## 2024-02-12 DIAGNOSIS — F411 Generalized anxiety disorder: Secondary | ICD-10-CM | POA: Diagnosis not present

## 2024-02-13 ENCOUNTER — Telehealth: Admitting: Pediatrics

## 2024-02-13 ENCOUNTER — Encounter: Payer: Self-pay | Admitting: Pediatrics

## 2024-02-13 DIAGNOSIS — F902 Attention-deficit hyperactivity disorder, combined type: Secondary | ICD-10-CM

## 2024-02-13 DIAGNOSIS — G4700 Insomnia, unspecified: Secondary | ICD-10-CM | POA: Diagnosis not present

## 2024-02-13 DIAGNOSIS — R400 Somnolence: Secondary | ICD-10-CM

## 2024-02-13 DIAGNOSIS — G40A09 Absence epileptic syndrome, not intractable, without status epilepticus: Secondary | ICD-10-CM

## 2024-02-13 NOTE — Assessment & Plan Note (Signed)
 Concerta well-tolerated. Continue.

## 2024-02-13 NOTE — Assessment & Plan Note (Signed)
 Difficulty maintaining sleep, suspected sleep apnea. Current trazodone dose ineffective. - Order sleep study to evaluate for sleep apnea. - Instruct to try taking a full tablet of trazodone. - Inform she can safely take up to one and a half tablets of trazodone if needed. - Schedule follow-up visit on April 11 to reassess sleep quality.

## 2024-02-13 NOTE — Assessment & Plan Note (Addendum)
 Lamotrigine well-tolerated and effective. Follows with neuro. Next visit in September. No recurrence.

## 2024-02-13 NOTE — Progress Notes (Signed)
 Telehealth Visit  I connected with  Gail Hill on 02/13/24 by a video enabled telemedicine application and verified that I am speaking with the correct person using two identifiers.   I discussed the limitations of evaluation and management by telemedicine. The patient expressed understanding and agreed to proceed.  Subjective:    Patient ID: Gail Hill, female    DOB: [birth year], 23 y.o.   MRN: [id number]  HPI: Gail Hill is a 23 y.o. female  Chief Complaint  Patient presents with   ADHD    Discussed the use of AI scribe software for clinical note transcription with the patient, who gave verbal consent to proceed.  History of Present Illness   Gail Hill is a 23 year old female with sleep disturbances who presents with difficulty maintaining sleep.  She experiences ongoing sleep disturbances characterized by waking up after a few hours of sleep. If she goes to bed at 11 PM, she wakes up by 2 AM, and if she goes to bed at 12 AM, she wakes up by 4 AM. She has been taking trazodone at a low dose for sleep, specifically half a tablet, but notes that it is not very effective.  She has a history of seizures and is currently taking lamotrigine for seizure control. There have been no changes to this medication, and there are no issues with seizure control at this time.  She also has a history of ADHD and is taking Concerta for management. There are no reported issues with the current medication regimen.          02/13/2024   10:00 AM 12/05/2023    8:00 AM  Results of the Epworth flowsheet  Sitting and reading 2 3  Watching TV 2 3  Sitting, inactive in a public place (e.g. a theatre or a meeting) 1 2  As a passenger in a car for an hour without a break 2 2  Lying down to rest in the afternoon when circumstances permit 3 2  Sitting and talking to someone 0 2  Sitting quietly after a lunch without alcohol 0 3  In a car, while stopped for a few minutes in traffic 0 2  Total  score 10 19    Relevant past medical, surgical, family and social history reviewed and updated as indicated. Interim medical history since our last visit reviewed. Allergies and medications reviewed and updated.  ROS per HPI unless specifically indicated above     Objective:    LMP 02/07/2024 (Exact Date)   Wt Readings from Last 3 Encounters:  01/06/24 207 lb 12.8 oz (94.3 kg)  12/04/23 201 lb 3.2 oz (91.3 kg)  08/28/23 200 lb 12.8 oz (91.1 kg)     Physical Exam Constitutional:      General: She is not in acute distress.    Appearance: Normal appearance.  Neurological:     General: No focal deficit present.     Mental Status: She is alert. Mental status is at baseline.      LIMITED EXAM GIVEN VIDEO VISIT     Assessment & Plan:  Assessment & Plan   Daytime sleepiness -     Ambulatory referral to Sleep Studies  Attention deficit hyperactivity disorder (ADHD), combined type, mild Assessment & Plan: Concerta well-tolerated. Continue.   Insomnia, unspecified type Assessment & Plan: Difficulty maintaining sleep, suspected sleep apnea. Current trazodone dose ineffective. - Order sleep study to evaluate for sleep apnea. - Instruct to try taking a full tablet of  trazodone. - Inform she can safely take up to one and a half tablets of trazodone if needed. - Schedule follow-up visit on April 11 to reassess sleep quality.  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 Follows with neuro. Next visit in September. No recurrence.      Follow up plan: Already scheduled ADHD follow up in April  Gail Confer, MD   This visit was completed via video visit through MyChart due to the restrictions of the COVID-19 pandemic. All issues as above were discussed and addressed. Physical exam was done as above through visual confirmation on video  through MyChart. If it was felt that the patient should be evaluated in the office, they were directed there. The patient verbally consented to this visit. Location of the patient: home Location of the provider: work Those involved with this call:  Provider: Modena Nunnery, MD CMA:  Gail Hill, CMA Time spent on call:  10 minutes with patient face to face via video conference. More than 50% of this time was spent in counseling and coordination of care. 10 minutes total spent in review of patient's record and preparation of their chart. Total time spent on this encounter: 20 minutes.

## 2024-02-13 NOTE — Patient Instructions (Signed)
 Try taking 1 full tab of the trazadone  If that is still not working, consider take 1 and a half pills (150mg ) to see if that helps with sleep a little more  Let me know if you need anything else form my end! Have a great weekend!

## 2024-02-27 ENCOUNTER — Ambulatory Visit: Payer: Medicaid Other | Admitting: Pediatrics

## 2024-03-04 DIAGNOSIS — F411 Generalized anxiety disorder: Secondary | ICD-10-CM | POA: Diagnosis not present

## 2024-03-04 DIAGNOSIS — F338 Other recurrent depressive disorders: Secondary | ICD-10-CM | POA: Diagnosis not present

## 2024-03-10 DIAGNOSIS — G4730 Sleep apnea, unspecified: Secondary | ICD-10-CM | POA: Diagnosis not present

## 2024-03-18 DIAGNOSIS — F411 Generalized anxiety disorder: Secondary | ICD-10-CM | POA: Diagnosis not present

## 2024-03-18 DIAGNOSIS — F338 Other recurrent depressive disorders: Secondary | ICD-10-CM | POA: Diagnosis not present

## 2024-03-23 ENCOUNTER — Encounter: Payer: Self-pay | Admitting: Pediatrics

## 2024-03-23 ENCOUNTER — Ambulatory Visit (INDEPENDENT_AMBULATORY_CARE_PROVIDER_SITE_OTHER): Admitting: Pediatrics

## 2024-03-23 VITALS — BP 112/58 | HR 68 | Temp 97.8°F | Wt 208.4 lb

## 2024-03-23 DIAGNOSIS — Z6835 Body mass index (BMI) 35.0-35.9, adult: Secondary | ICD-10-CM

## 2024-03-23 DIAGNOSIS — G40A09 Absence epileptic syndrome, not intractable, without status epilepticus: Secondary | ICD-10-CM

## 2024-03-23 DIAGNOSIS — F902 Attention-deficit hyperactivity disorder, combined type: Secondary | ICD-10-CM | POA: Diagnosis not present

## 2024-03-23 NOTE — Progress Notes (Unsigned)
   Office Visit  BP (!) 112/58   Pulse 68   Temp 97.8 F (36.6 C) (Oral)   Wt 208 lb 6.4 oz (94.5 kg)   LMP 02/25/2024 (Exact Date)   SpO2 99%   BMI 35.77 kg/m    Subjective:    Patient ID: Gail Hill, female    DOB: 2001-06-12, 23 y.o.   MRN: [id number]  HPI: Gail Hill is a 23 y.o. female  Chief Complaint  Patient presents with   ADHD    Discussed the use of AI scribe software for clinical note transcription with the patient, who gave verbal consent to proceed.  History of Present Illness    Relevant past medical, surgical, family and social history reviewed and updated as indicated. Interim medical history since our last visit reviewed. Allergies and medications reviewed and updated.  ROS per HPI unless specifically indicated above     Objective:    BP (!) 112/58   Pulse 68   Temp 97.8 F (36.6 C) (Oral)   Wt 208 lb 6.4 oz (94.5 kg)   LMP 02/25/2024 (Exact Date)   SpO2 99%   BMI 35.77 kg/m   Wt Readings from Last 3 Encounters:  03/23/24 208 lb 6.4 oz (94.5 kg)  01/06/24 207 lb 12.8 oz (94.3 kg)  12/04/23 201 lb 3.2 oz (91.3 kg)     Physical Exam      03/23/2024    1:37 PM 02/13/2024   10:54 AM 01/06/2024   11:25 AM 12/11/2023   12:57 PM 12/04/2023   11:03 AM  Depression screen PHQ 2/9  Decreased Interest 1 0 0 1 1  Down, Depressed, Hopeless 0 0 0 0 0  PHQ - 2 Score 1 0 0 1 1  Altered sleeping 3 0 1 0 3  Tired, decreased energy 2 0 1 0 3  Change in appetite 2 0 1 0 1  Feeling bad or failure about yourself  0 0 0 0 1  Trouble concentrating 0 0 0 2 2  Moving slowly or fidgety/restless 0 0 0 1 0  Suicidal thoughts 0 0 0 0 0  PHQ-9 Score 8 0 3 4 11   Difficult doing work/chores Somewhat difficult Not difficult at all Somewhat difficult Not difficult at all Somewhat difficult       03/23/2024    1:38 PM 02/13/2024   10:54 AM 01/06/2024   11:25 AM 12/11/2023   12:59 PM  GAD 7 : Generalized Anxiety Score  Nervous, Anxious, on Edge 1 0 1 1   Control/stop worrying 0 0 0 0  Worry too much - different things 1 0 1 1  Trouble relaxing 0 0 0 0  Restless 0 0 0 1  Easily annoyed or irritable 1 0 0 0  Afraid - awful might happen 0 0 0 0  Total GAD 7 Score 3 0 2 3  Anxiety Difficulty Not difficult at all Not difficult at all Not difficult at all Not difficult at all       Assessment & Plan:  Assessment & Plan   There are no diagnoses linked to this encounter.   Assessment and Plan Assessment & Plan      Follow up plan: No follow-ups on file.  Hadassah Letters, MD

## 2024-03-23 NOTE — Patient Instructions (Addendum)
 Continue current regimen for ADHD  Please let me know if you'd like to meet with a nutritionist.  The weight management medications are all listed below.  Weight medications: Injectable medications: GLP1 agonist like ozempic, wegovy, zepbound, mounjaro  Once weekly injections Most common side effects: nausea, diarrhea, abdominal pain, constipation, acid reflux Higher risk for pancreatitis (inflammation of pancreas) - if developed this while on the medication would stop it Contraindications: history of medullary thyroid cancer, MEN2 disorders, pregnancy Depending on which injection, can see between 14-26% body weight loss Oral medications - can do single pill or combined as below Welbutrin/naltrexone SE: headaches, insomnia, increased anxiety 8-10% weight loss Phentermine/topiramate SE: headaches, insomnia, increased anxiety 8-10% weight loss With topiramate: nausea is most common Orlistat SE: greasy stools, abdominal pain 2% weight loss

## 2024-03-24 ENCOUNTER — Encounter: Payer: Self-pay | Admitting: Pediatrics

## 2024-03-24 LAB — THYROID PANEL WITH TSH
Free Thyroxine Index: 2.4 (ref 1.2–4.9)
T3 Uptake Ratio: 28 % (ref 24–39)
T4, Total: 8.7 ug/dL (ref 4.5–12.0)
TSH: 1.68 u[IU]/mL (ref 0.450–4.500)

## 2024-03-24 LAB — HEMOGLOBIN A1C
Est. average glucose Bld gHb Est-mCnc: 103 mg/dL
Hgb A1c MFr Bld: 5.2 % (ref 4.8–5.6)

## 2024-03-24 MED ORDER — METHYLPHENIDATE HCL ER (OSM) 18 MG PO TBCR
18.0000 mg | EXTENDED_RELEASE_TABLET | Freq: Every day | ORAL | 0 refills | Status: DC
Start: 2024-03-24 — End: 2024-06-22

## 2024-03-24 NOTE — Assessment & Plan Note (Signed)
 ADHD managed with Concerta , effective. Prefers virtual follow-up. - Continue current dose of Concerta . - Send prescription for three months. - Plan virtual follow-up in three months.

## 2024-03-24 NOTE — Assessment & Plan Note (Signed)
 On lamictal. Has neurology eval in August. No breakthrough symptoms.

## 2024-04-01 DIAGNOSIS — F411 Generalized anxiety disorder: Secondary | ICD-10-CM | POA: Diagnosis not present

## 2024-04-01 DIAGNOSIS — F338 Other recurrent depressive disorders: Secondary | ICD-10-CM | POA: Diagnosis not present

## 2024-04-22 DIAGNOSIS — F411 Generalized anxiety disorder: Secondary | ICD-10-CM | POA: Diagnosis not present

## 2024-04-22 DIAGNOSIS — F338 Other recurrent depressive disorders: Secondary | ICD-10-CM | POA: Diagnosis not present

## 2024-05-06 DIAGNOSIS — F338 Other recurrent depressive disorders: Secondary | ICD-10-CM | POA: Diagnosis not present

## 2024-05-06 DIAGNOSIS — F411 Generalized anxiety disorder: Secondary | ICD-10-CM | POA: Diagnosis not present

## 2024-05-20 DIAGNOSIS — F411 Generalized anxiety disorder: Secondary | ICD-10-CM | POA: Diagnosis not present

## 2024-05-20 DIAGNOSIS — F338 Other recurrent depressive disorders: Secondary | ICD-10-CM | POA: Diagnosis not present

## 2024-06-03 DIAGNOSIS — F411 Generalized anxiety disorder: Secondary | ICD-10-CM | POA: Diagnosis not present

## 2024-06-03 DIAGNOSIS — F338 Other recurrent depressive disorders: Secondary | ICD-10-CM | POA: Diagnosis not present

## 2024-06-09 ENCOUNTER — Ambulatory Visit: Payer: Self-pay

## 2024-06-09 ENCOUNTER — Telehealth: Admitting: Physician Assistant

## 2024-06-09 DIAGNOSIS — J069 Acute upper respiratory infection, unspecified: Secondary | ICD-10-CM

## 2024-06-09 MED ORDER — FLUTICASONE PROPIONATE 50 MCG/ACT NA SUSP: 2.0000 | Freq: Every day | NASAL | 0 refills | Status: DC

## 2024-06-09 MED ORDER — IBUPROFEN 600 MG PO TABS: 600.0000 mg | ORAL_TABLET | Freq: Three times a day (TID) | ORAL | 0 refills | Status: DC | PRN

## 2024-06-09 MED ORDER — LIDOCAINE VISCOUS HCL 2 % MT SOLN: OROMUCOSAL | 0 refills | Status: DC

## 2024-06-09 NOTE — Progress Notes (Signed)
 Virtual Visit Consent   Gail Hill, you are scheduled for a virtual visit with a [facility] provider today. Just as with appointments in the office, your consent must be obtained to participate. Your consent will be active for this visit and any virtual visit you may have with one of our providers in the next 365 days. If you have a MyChart account, a copy of this consent can be sent to you electronically.  As this is a virtual visit, video technology does not allow for your provider to perform a traditional examination. This may limit your provider's ability to fully assess your condition. If your provider identifies any concerns that need to be evaluated in person or the need to arrange testing (such as labs, EKG, etc.), we will make arrangements to do so. Although advances in technology are sophisticated, we cannot ensure that it will always work on either your end or our end. If the connection with a video visit is poor, the visit may have to be switched to a telephone visit. With either a video or telephone visit, we are not always able to ensure that we have a secure connection.  By engaging in this virtual visit, you consent to the provision of healthcare and authorize for your insurance to be billed (if applicable) for the services provided during this visit. Depending on your insurance coverage, you may receive a charge related to this service.  I need to obtain your verbal consent now. Are you willing to proceed with your visit today? Sidnee Gutmann has provided verbal consent on 06/09/2024 for a virtual visit (video or telephone). Delon CHRISTELLA Dickinson, PA-C  Date: 06/09/2024 2:43 PM   Virtual Visit via Video Note   IDelon CHRISTELLA Dickinson, connected with  Dyasia Depace  ([id number], 10/27/2001) on 06/09/24 at  2:30 PM EDT by a video-enabled telemedicine application and verified that I am speaking with the correct person using two identifiers.  Location: Patient: Virtual Visit Location  Patient: Home Provider: Virtual Visit Location Provider: Home Office   I discussed the limitations of evaluation and management by telemedicine and the availability of in person appointments. The patient expressed understanding and agreed to proceed.    History of Present Illness: Gail Hill is a 23 y.o. who identifies as a female who was assigned female at birth, and is being seen today for URI symptoms.  HPI: URI  This is a new problem. The current episode started yesterday. The problem has been gradually worsening. There has been no fever. Associated symptoms include congestion, headaches, a plugged ear sensation, sinus pain and a sore throat. Pertinent negatives include no coughing, diarrhea, ear pain, nausea, rhinorrhea or vomiting. Associated symptoms comments: Post nasal drainage. She has tried nothing for the symptoms. The treatment provided no relief.     Problems:  Patient Active Problem List   Diagnosis Date Noted   Insomnia 01/06/2024   Attention deficit hyperactivity disorder (ADHD), combined type, mild 12/11/2023   Absence seizure disorder (HCC) 02/01/2013    Allergies:  Allergies  Allergen Reactions   Latex Hives   Penicillins Other (See Comments)    Reaction unknown; reaction to penicillin in the family.   Medications:  Current Outpatient Medications:    fluticasone  (FLONASE ) 50 MCG/ACT nasal spray, Place 2 sprays into both nostrils daily., Disp: 16 g, Rfl: 0   ibuprofen  (ADVIL ) 600 MG tablet, Take 1 tablet (600 mg total) by mouth every 8 (eight) hours as needed., Disp: 30 tablet, Rfl: 0  lidocaine  (XYLOCAINE ) 2 % solution, Swallow 5-10 mL every 6 hours as needed for sore throat, Disp: 100 mL, Rfl: 0   cholecalciferol (VITAMIN D3) 25 MCG (1000 UNIT) tablet, Take 1,000 Units by mouth daily., Disp: , Rfl:    ferrous sulfate  325 (65 FE) MG tablet, Take 1 tablet (325 mg total) by mouth daily with breakfast., Disp: 90 tablet, Rfl: 1   lamoTRIgine 100 MG TBDP, Take 1  tablet by mouth 2 (two) times daily. neurology, Disp: , Rfl:    levonorgestrel  (MIRENA ) 20 MCG/DAY IUD, 1 each by Intrauterine route once., Disp: , Rfl:    methylphenidate  (CONCERTA ) 18 MG PO CR tablet, Take 1 tablet (18 mg total) by mouth daily., Disp: 30 tablet, Rfl: 0   traZODone  (DESYREL ) 50 MG tablet, Take 0.5-1 tablets (25-50 mg total) by mouth at bedtime as needed for sleep., Disp: 30 tablet, Rfl: 3  Observations/Objective: Patient is well-developed, well-nourished in no acute distress.  Resting comfortably at home.  Head is normocephalic, atraumatic.  No labored breathing.  Speech is clear and coherent with logical content.  Patient is alert and oriented at baseline.    Assessment and Plan: 1. Viral URI (Primary) - fluticasone  (FLONASE ) 50 MCG/ACT nasal spray; Place 2 sprays into both nostrils daily.  Dispense: 16 g; Refill: 0 - lidocaine  (XYLOCAINE ) 2 % solution; Swallow 5-10 mL every 6 hours as needed for sore throat  Dispense: 100 mL; Refill: 0 - ibuprofen  (ADVIL ) 600 MG tablet; Take 1 tablet (600 mg total) by mouth every 8 (eight) hours as needed.  Dispense: 30 tablet; Refill: 0  - Suspect viral URI, advised could take an at home covid test if desired - Symptomatic medications of choice over the counter as needed - Prescribed Flonase  for nasal congestion - Ibuprofen  for generalized pain and headaches - Viscous lidocaine  for sore throat - Push fluids - Rest - Seek further evaluation if symptoms change or worsen   Follow Up Instructions: I discussed the assessment and treatment plan with the patient. The patient was provided an opportunity to ask questions and all were answered. The patient agreed with the plan and demonstrated an understanding of the instructions.  A copy of instructions were sent to the patient via MyChart unless otherwise noted below.    The patient was advised to call back or seek an in-person evaluation if the symptoms worsen or if the condition fails  to improve as anticipated.    Delon CHRISTELLA Dickinson, PA-C

## 2024-06-09 NOTE — Patient Instructions (Signed)
 Lawrie Efaw, thank you for joining Delon CHRISTELLA Dickinson, PA-C for today's virtual visit.  While this provider is not your primary care provider (PCP), if your PCP is located in our provider database this encounter information will be shared with them immediately following your visit.   A [facility] MyChart account gives you access to today's visit and all your visits, tests, and labs performed at [facility]  click here if you don't have a [facility] MyChart account or go to [email or web address]  Consent: (Patient) Gail Hill provided verbal consent for this virtual visit at the beginning of the encounter.  Current Medications:  Current Outpatient Medications:    fluticasone  (FLONASE ) 50 MCG/ACT nasal spray, Place 2 sprays into both nostrils daily., Disp: 16 g, Rfl: 0   ibuprofen  (ADVIL ) 600 MG tablet, Take 1 tablet (600 mg total) by mouth every 8 (eight) hours as needed., Disp: 30 tablet, Rfl: 0   lidocaine  (XYLOCAINE ) 2 % solution, Swallow 5-10 mL every 6 hours as needed for sore throat, Disp: 100 mL, Rfl: 0   cholecalciferol (VITAMIN D3) 25 MCG (1000 UNIT) tablet, Take 1,000 Units by mouth daily., Disp: , Rfl:    ferrous sulfate  325 (65 FE) MG tablet, Take 1 tablet (325 mg total) by mouth daily with breakfast., Disp: 90 tablet, Rfl: 1   lamoTRIgine 100 MG TBDP, Take 1 tablet by mouth 2 (two) times daily. neurology, Disp: , Rfl:    levonorgestrel  (MIRENA ) 20 MCG/DAY IUD, 1 each by Intrauterine route once., Disp: , Rfl:    methylphenidate  (CONCERTA ) 18 MG PO CR tablet, Take 1 tablet (18 mg total) by mouth daily., Disp: 30 tablet, Rfl: 0   traZODone  (DESYREL ) 50 MG tablet, Take 0.5-1 tablets (25-50 mg total) by mouth at bedtime as needed for sleep., Disp: 30 tablet, Rfl: 3   Medications ordered in this encounter:  Meds ordered this encounter  Medications   fluticasone  (FLONASE ) 50 MCG/ACT nasal spray    Sig: Place 2 sprays into both nostrils daily.    Dispense:  16 g     Refill:  0    Supervising Provider:   LAMPTEY, PHILIP O [id number]   lidocaine  (XYLOCAINE ) 2 % solution    Sig: Swallow 5-10 mL every 6 hours as needed for sore throat    Dispense:  100 mL    Refill:  0    Supervising Provider:   BLAISE ALEENE KIDD [[id number]]   ibuprofen  (ADVIL ) 600 MG tablet    Sig: Take 1 tablet (600 mg total) by mouth every 8 (eight) hours as needed.    Dispense:  30 tablet    Refill:  0    Supervising Provider:   LAMPTEY, PHILIP O [[id number]]     *If you need refills on other medications prior to your next appointment, please contact your pharmacy*  Follow-Up: Call back or seek an in-person evaluation if the symptoms worsen or if the condition fails to improve as anticipated.  [facility] [phone]  Other Instructions Viral Respiratory Infection A respiratory infection is an illness that affects part of the respiratory system, such as the lungs, nose, or throat. A respiratory infection that is caused by a virus is called a viral respiratory infection. Common types of viral respiratory infections include: A cold. The flu (influenza). A respiratory syncytial virus (RSV) infection. What are the causes? This condition is caused by a virus. The virus may spread through contact with droplets or direct contact with infected people or their  mucus or secretions. The virus may spread from person to person (is contagious). What are the signs or symptoms? Symptoms of this condition include: A stuffy or runny nose. A sore throat or cough. Shortness of breath or difficulty breathing. Yellow or green mucus (sputum). Other symptoms may include: A fever. Sweating or chills. Fatigue. Achy muscles. A headache. How is this diagnosed? This condition may be diagnosed based on: Your symptoms. A physical exam. Testing of secretions from the nose or throat. Chest X-ray. How is this treated? This condition may be treated with medicines, such as: Antiviral  medicine. This may shorten the length of time a person has symptoms. Expectorants. These make it easier to cough up mucus. Decongestant nasal sprays. Acetaminophen or NSAIDs, such as ibuprofen , to relieve fever and pain. Antibiotic medicines are not prescribed for viral infections.This is because antibiotics are designed to kill bacteria. They do not kill viruses. Follow these instructions at home: Managing pain and congestion Take over-the-counter and prescription medicines only as told by your health care provider. If you have a sore throat, gargle with a mixture of salt and water 3-4 times a day or as needed. To make salt water, completely dissolve -1 tsp (3-6 g) of salt in 1 cup (237 mL) of warm water. Use nose drops made from salt water to ease congestion and soften raw skin around your nose. Take 2 tsp (10 mL) of honey at bedtime to lessen coughing at night. Do not give honey to children who are younger than 1 year. Drink enough fluid to keep your urine pale yellow. This helps prevent dehydration and helps loosen up mucus. General instructions  Rest as much as possible. Do not drink alcohol. Do not use any products that contain nicotine or tobacco. These products include cigarettes, chewing tobacco, and vaping devices, such as e-cigarettes. If you need help quitting, ask your health care provider. Keep all follow-up visits. This is important. How is this prevented?     Get an annual flu shot. You may get the flu shot in late summer, fall, or winter. Ask your health care provider when you should get your flu shot. Avoid spreading your infection to other people. If you are sick: Wash your hands with soap and water often, especially after you cough or sneeze. Wash for at least 20 seconds. If soap and water are not available, use alcohol-based hand sanitizer. Cover your mouth when you cough. Cover your nose and mouth when you sneeze. Do not share cups or eating utensils. Clean commonly  used objects often. Clean commonly touched surfaces. Stay home from work or school as told by your health care provider. Avoid contact with people who are sick during cold and flu season. This is generally fall and winter. Contact a health care provider if: Your symptoms last for 10 days or longer. Your symptoms get worse over time. You have severe sinus pain in your face or forehead. The glands in your jaw or neck become very swollen. You have shortness of breath. Get help right away if you: Feel pain or pressure in your chest. Have trouble breathing. Faint or feel like you will faint. Have severe and persistent vomiting. Feel confused or disoriented. These symptoms may represent a serious problem that is an emergency. Do not wait to see if the symptoms will go away. Get medical help right away. Call your local emergency services (911 in the [place] Do not drive yourself to the hospital. Summary A respiratory infection is an illness that  affects part of the respiratory system, such as the lungs, nose, or throat. A respiratory infection that is caused by a virus is called a viral respiratory infection. Common types of viral respiratory infections include a cold, influenza, and respiratory syncytial virus (RSV) infection. Symptoms of this condition include a stuffy or runny nose, cough, fatigue, achy muscles, sore throat, and fevers or chills. Antibiotic medicines are not prescribed for viral infections. This is because antibiotics are designed to kill bacteria. They are not effective against viruses. This information is not intended to replace advice given to you by your health care provider. Make sure you discuss any questions you have with your health care provider. Document Revised: 02/08/2021 Document Reviewed: 02/08/2021 Elsevier Patient Education  2024 Elsevier Inc.   If you have been instructed to have an in-person evaluation today at a local Urgent Care facility, please use the link  below. It will take you to a list of all of our available [facility], including address, phone number and hours of operation. Please do not delay care.  [facility]  If you or a family member do not have a primary care provider, use the link below to schedule a visit and establish care. When you choose a [facility] primary care physician or advanced practice provider, you gain a long-term partner in health. Find a Primary Care Provider  Learn more about [facility]'s in-office and virtual care options: [facility] - Get Care Now

## 2024-06-09 NOTE — Telephone Encounter (Signed)
 FYI Only or Action Required?: FYI only for provider.  Patient was last seen in primary care on 03/23/2024 by Gail Hadassah SQUIBB, MD.  Called Nurse Triage reporting Sore Throat.  Symptoms began yesterday.  Interventions attempted: Rest, hydration, or home remedies.  Symptoms are: unchanged.  Triage Disposition: Home Care  Patient/caregiver understands and will follow disposition?: Virtual urgent care appointment made per patient's request, no appointments available in the office.     Copied from CRM [id number]. Topic: Appointments - Appointment Scheduling >> Jun 09, 2024 11:47 AM [name] wrote: Patient/patient representative is calling to schedule an appointment. Refer to attachments for appointment information.    Patient states has a sore throat/stuffy nose causing face pain  No appointments available till August and that is too far out for the patient, wants to know if she can be worked in somewhere. Please advise # [phone]    Reason for Disposition  [1] Sore throat with cough/cold symptoms AND [2] present < 5 days  Answer Assessment - Initial Assessment Questions 1. ONSET: When did the throat start hurting? (Hours or days ago)      Yesterday  2. SEVERITY: How bad is the sore throat? (Scale 1-10; mild, moderate or severe)     5/10 3. STREP EXPOSURE: Has there been any exposure to strep within the past week? If Yes, ask: What type of contact occurred?      No 4.  VIRAL SYMPTOMS: Are there any symptoms of a cold, such as a runny nose, cough, hoarse voice or red eyes?      Stuffy nose  5. FEVER: Do you have a fever? If Yes, ask: What is your temperature, how was it measured, and when did it start?     No 6. PUS ON THE TONSILS: Is there pus on the tonsils in the back of your throat?     No 7. OTHER SYMPTOMS: Do you have any other symptoms? (e.g., difficulty breathing, headache, rash)     Nasal congestion, sinus pressure/pain 8. PREGNANCY: Is there any  chance you are pregnant? When was your last menstrual period?     No  Protocols used: Sore Throat-A-AH

## 2024-06-17 DIAGNOSIS — F411 Generalized anxiety disorder: Secondary | ICD-10-CM | POA: Diagnosis not present

## 2024-06-17 DIAGNOSIS — F338 Other recurrent depressive disorders: Secondary | ICD-10-CM | POA: Diagnosis not present

## 2024-06-22 ENCOUNTER — Ambulatory Visit: Admitting: Pediatrics

## 2024-06-22 VITALS — BP 118/77 | HR 87 | Temp 98.0°F | Wt 212.0 lb

## 2024-06-22 DIAGNOSIS — G40A09 Absence epileptic syndrome, not intractable, without status epilepticus: Secondary | ICD-10-CM | POA: Diagnosis not present

## 2024-06-22 DIAGNOSIS — G4700 Insomnia, unspecified: Secondary | ICD-10-CM | POA: Diagnosis not present

## 2024-06-22 DIAGNOSIS — L750 Bromhidrosis: Secondary | ICD-10-CM

## 2024-06-22 DIAGNOSIS — F902 Attention-deficit hyperactivity disorder, combined type: Secondary | ICD-10-CM | POA: Diagnosis not present

## 2024-06-22 MED ORDER — ALUMINUM CHLORIDE 20 % EX SOLN: Freq: Two times a day (BID) | CUTANEOUS | 1 refills | Status: DC

## 2024-06-22 MED ORDER — METHYLPHENIDATE HCL ER (OSM) 27 MG PO TBCR: 27.0000 mg | EXTENDED_RELEASE_TABLET | Freq: Every day | ORAL | 0 refills | Status: DC

## 2024-06-22 NOTE — Progress Notes (Signed)
 Office Visit  BP 118/77   Pulse 87   Temp 98 F (36.7 C) (Oral)   Wt 212 lb (96.2 kg)   LMP 06/03/2024 (Approximate)   SpO2 98%   BMI 36.39 kg/m    Subjective:    Patient ID: Gail Hill, female    DOB: Mar 02, 2001, 23 y.o.   MRN: [id number]  HPI: Gail Hill is a 23 y.o. female  Chief Complaint  Patient presents with   ADHD    Discussed the use of AI scribe software for clinical note transcription with the patient, who gave verbal consent to proceed.  History of Present Illness   Gail Hill is a 23 year old female with ADHD who presents with concerns about body odor and medication management.  She is currently taking Concerta  for ADHD, which she finds effective, but she has been taking an additional dose later in the day to maintain focus for work. This results in a 'crash' and necessitates a nap once the medication wears off. She is currently taking the lowest dose of Concerta .  She is experiencing issues with body odor, noting that her current deodorant, Degree stick (blue), has stopped working effectively. She has a history of needing to switch deodorants frequently due to them losing effectiveness. She reports excessive sweating, which she has always experienced. Her blood sugar was tested three months ago and was normal.  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 She enjoys the work but notes that it requires effective management of her ADHD symptoms. She is in a long-distance relationship with her boyfriend, whom she plans to see at the end of the month. She has a supportive family and social network.  She has not had seizures in over a year and is considering rescheduling an appointment with her neurologist to discuss her license. She has not had any medication changes recently and is awaiting regular blood work. No other changes in her health besides the issues with body odor.        Relevant past  medical, surgical, family and social history reviewed and updated as indicated. Interim medical history since our last visit reviewed. Allergies and medications reviewed and updated.  ROS per HPI unless specifically indicated above     Objective:    BP 118/77   Pulse 87   Temp 98 F (36.7 C) (Oral)   Wt 212 lb (96.2 kg)   LMP 06/03/2024 (Approximate)   SpO2 98%   BMI 36.39 kg/m   Wt Readings from Last 3 Encounters:  06/22/24 212 lb (96.2 kg)  03/23/24 208 lb 6.4 oz (94.5 kg)  01/06/24 207 lb 12.8 oz (94.3 kg)     Physical Exam Constitutional:      Appearance: Normal appearance.  Pulmonary:     Effort: Pulmonary effort is normal.  Musculoskeletal:        General: Normal range of motion.  Skin:    Comments: Normal skin color  Neurological:     General: No focal deficit present.     Mental Status: She is alert. Mental status is at baseline.  Psychiatric:        Mood and Affect: Mood normal.        Behavior: Behavior normal.        Thought Content: Thought content normal.         06/22/2024    3:00 PM 03/23/2024    1:37 PM 02/13/2024   10:54 AM 01/06/2024  11:25 AM 12/11/2023   12:57 PM  Depression screen PHQ 2/9  Decreased Interest 1 1 0 0 1  Down, Depressed, Hopeless 0 0 0 0 0  PHQ - 2 Score 1 1 0 0 1  Altered sleeping 2 3 0 1 0  Tired, decreased energy 1 2 0 1 0  Change in appetite 1 2 0 1 0  Feeling bad or failure about yourself  0 0 0 0 0  Trouble concentrating 1 0 0 0 2  Moving slowly or fidgety/restless 0 0 0 0 1  Suicidal thoughts 0 0 0 0 0  PHQ-9 Score 6 8 0 3 4  Difficult doing work/chores Somewhat difficult Somewhat difficult Not difficult at all Somewhat difficult Not difficult at all       06/22/2024    3:00 PM 03/23/2024    1:38 PM 02/13/2024   10:54 AM 01/06/2024   11:25 AM  GAD 7 : Generalized Anxiety Score  Nervous, Anxious, on Edge 1 1 0 1  Control/stop worrying 0 0 0 0  Worry too much - different things 0 1 0 1  Trouble relaxing 0 0 0 0   Restless 0 0 0 0  Easily annoyed or irritable 1 1 0 0  Afraid - awful might happen 0 0 0 0  Total GAD 7 Score 2 3 0 2  Anxiety Difficulty Not difficult at all Not difficult at all Not difficult at all Not difficult at all       Assessment & Plan:  Assessment & Plan   Attention deficit hyperactivity disorder (ADHD), combined type, mild Assessment & Plan: ADHD is well-managed with medication, but she experiences a crash and sleepiness when the medication wears off. Increasing the dose of Concerta  is preferred to avoid the need for a second dose. Increase the dose of Concerta  to avoid the need for a second dose. Advise against taking a second dose of Concerta  to prevent sleep disturbances.  Orders: -     Methylphenidate  HCl ER (OSM); Take 1 tablet (27 mg total) by mouth daily.  Dispense: 30 tablet; Refill: 0  Body odor She reports issues with body odor and the current deodorant not being effective. Drysol, a prescription deodorant, is recommended to address excessive odor and sweat. Prescribe Drysol to address excessive odor and sweat. Instruct to use Drysol once or twice a day as needed. Advise to contact if insurance does not cover Drysol or if it is not effective. -     Aluminum  Chloride; Apply topically 2 (two) times daily.  Dispense: 35 mL; Refill: 1  Insomnia, unspecified type Assessment & Plan: Insomnia is related to the ADHD medication schedule and the crash experienced when the medication wears off. Monitor sleep patterns after adjusting the ADHD medication dose. Consider resuming sleep medication if insomnia persists.  Absence seizure disorder (HCC) Seizure disorder is well-controlled with no seizures reported in over a year. She is seeking to obtain a driver's license and requires documentation of seizure control. Write a letter for the [facility] confirming over a year of seizure-free status and no medication changes. Send the letter through MyChart for her use at the [facility].      Follow up plan: Return in about 4 weeks (around 07/20/2024) for ADHD, (virtual).  Hadassah SHAUNNA Nett, MD

## 2024-06-22 NOTE — Patient Instructions (Signed)
Increase to concerta 27 mg

## 2024-06-25 ENCOUNTER — Ambulatory Visit: Admitting: Pediatrics

## 2024-06-27 ENCOUNTER — Encounter: Payer: Self-pay | Admitting: Pediatrics

## 2024-06-27 NOTE — Assessment & Plan Note (Signed)
 Insomnia is related to the ADHD medication schedule and the crash experienced when the medication wears off. Monitor sleep patterns after adjusting the ADHD medication dose. Consider resuming sleep medication if insomnia persists.

## 2024-06-27 NOTE — Assessment & Plan Note (Signed)
 ADHD is well-managed with medication, but she experiences a crash and sleepiness when the medication wears off. Increasing the dose of Concerta  is preferred to avoid the need for a second dose. Increase the dose of Concerta  to avoid the need for a second dose. Advise against taking a second dose of Concerta  to prevent sleep disturbances.

## 2024-07-01 DIAGNOSIS — F411 Generalized anxiety disorder: Secondary | ICD-10-CM | POA: Diagnosis not present

## 2024-07-01 DIAGNOSIS — F338 Other recurrent depressive disorders: Secondary | ICD-10-CM | POA: Diagnosis not present

## 2024-07-09 DIAGNOSIS — F411 Generalized anxiety disorder: Secondary | ICD-10-CM | POA: Diagnosis not present

## 2024-07-09 DIAGNOSIS — F338 Other recurrent depressive disorders: Secondary | ICD-10-CM | POA: Diagnosis not present

## 2024-07-20 ENCOUNTER — Encounter: Payer: Self-pay | Admitting: Pediatrics

## 2024-07-20 ENCOUNTER — Telehealth: Admitting: Pediatrics

## 2024-07-20 DIAGNOSIS — F902 Attention-deficit hyperactivity disorder, combined type: Secondary | ICD-10-CM | POA: Diagnosis not present

## 2024-07-20 DIAGNOSIS — R61 Generalized hyperhidrosis: Secondary | ICD-10-CM

## 2024-07-20 DIAGNOSIS — G4700 Insomnia, unspecified: Secondary | ICD-10-CM

## 2024-07-20 MED ORDER — METHYLPHENIDATE HCL ER (OSM) 27 MG PO TBCR
27.0000 mg | EXTENDED_RELEASE_TABLET | Freq: Every day | ORAL | 0 refills | Status: AC
Start: 2024-07-23 — End: 2024-08-22

## 2024-07-20 MED ORDER — METHYLPHENIDATE HCL ER (OSM) 27 MG PO TBCR: 27.0000 mg | EXTENDED_RELEASE_TABLET | Freq: Every day | ORAL | 0 refills | Status: AC

## 2024-07-20 NOTE — Assessment & Plan Note (Signed)
 Has trazadone prn. CTM.

## 2024-07-20 NOTE — Assessment & Plan Note (Addendum)
 ADHD symptoms better controlled with increase Concerta  to 27 mg. Sleep disturbances due to external factors.  - Continue Concerta  27 mg with automatic refill.

## 2024-07-20 NOTE — Progress Notes (Signed)
 Telehealth Visit  I connected with  [name] on 07/20/24 by a video enabled telemedicine application and verified that I am speaking with the correct person using two identifiers.   I discussed the limitations of evaluation and management by telemedicine. The patient expressed understanding and agreed to proceed.  Subjective:    Patient ID: Gail Hill, female    DOB: 30-Mar-2001, 23 y.o.   MRN: [id number]  HPI: Gail Hill is a 23 y.o. female  Chief Complaint  Patient presents with   ADHD    Discussed the use of AI scribe software for clinical note transcription with the patient, who gave verbal consent to proceed.  History of Present Illness   Gail Hill is a 23 year old female with ADHD who presents for a follow-up visit.  She is currently taking Concerta  27 mg, which she reports lasts almost all day and that she likes the higher dose.  Her sleep has been generally good, though she experienced disturbances in the last couple of days due to her husband's presence, unrelated to her medication.  She has not yet picked up her prescription for Drysol, a deodorant for hyperhidrosis, as it was unavailable when her mother attempted to collect it. She plans to follow up on this issue.      Relevant past medical, surgical, family and social history reviewed and updated as indicated. Interim medical history since our last visit reviewed. Allergies and medications reviewed and updated.  ROS per HPI unless specifically indicated above     Objective:    LMP 07/01/2024 (Approximate)   Wt Readings from Last 3 Encounters:  06/22/24 212 lb (96.2 kg)  03/23/24 208 lb 6.4 oz (94.5 kg)  01/06/24 207 lb 12.8 oz (94.3 kg)     Physical Exam Constitutional:      General: She is not in acute distress.    Appearance: Normal appearance.  Neurological:     General: No focal deficit present.     Mental Status: She is alert. Mental status is at baseline.      LIMITED EXAM GIVEN  VIDEO VISIT     Assessment & Plan:  Assessment & Plan   Attention deficit hyperactivity disorder (ADHD), combined type, mild Assessment & Plan: ADHD symptoms better controlled with increase Concerta  to 27 mg. Sleep disturbances due to external factors.  - Continue Concerta  27 mg with automatic refill.  Orders: -     Methylphenidate  HCl ER ([facility]); Take 1 tablet (27 mg total) by mouth daily.  Dispense: 30 tablet; Refill: 0 -     Methylphenidate  HCl ER (OSM); Take 1 tablet (27 mg total) by mouth daily.  Dispense: 30 tablet; Refill: 0 -     Methylphenidate  HCl ER (OSM); Take 1 tablet (27 mg total) by mouth daily.  Dispense: 30 tablet; Refill: 0  Insomnia, unspecified type Assessment & Plan: Has trazadone prn. CTM.    Hyperhidrosis Prescription for Drysol not picked up, likely due to pharmacy issue. - Have staff call pharmacy to resolve Drysol prescription issue.      Follow up plan: 3 mo via video  Hadassah SHAUNNA Nett, MD   This visit was completed via video visit through MyChart due to the restrictions of the COVID-19 pandemic. All issues as above were discussed and addressed. Physical exam was done as above through visual confirmation on video through MyChart. If it was felt that the patient should be evaluated in the office, they were directed there. The patient verbally consented  to this visit.  Location of the patient: home Location of the provider: work Those involved with this call:  Provider: Hadassah Nett, MD CMA: Cena Maffucci, CMA Time spent on call: 15 minutes with patient face to face via video conference. More than 50% of this time was spent in counseling and coordination of care. 15 minutes total spent in review of patient's record and preparation of their chart. Total time spent on this encounter: 30 minutes.

## 2024-07-22 DIAGNOSIS — F411 Generalized anxiety disorder: Secondary | ICD-10-CM | POA: Diagnosis not present

## 2024-07-22 DIAGNOSIS — F338 Other recurrent depressive disorders: Secondary | ICD-10-CM | POA: Diagnosis not present

## 2024-08-02 ENCOUNTER — Telehealth: Payer: Self-pay

## 2024-08-02 ENCOUNTER — Other Ambulatory Visit (HOSPITAL_COMMUNITY): Payer: Self-pay

## 2024-08-02 NOTE — Telephone Encounter (Signed)
 Pharmacy Patient Advocate Encounter   Received notification from [employer]base that prior authorization for Drysol 20% solution is required/requested.   Insurance verification completed.   The patient is insured through [employer] .   Per test claim: Product not on formulary, please consider changing therapies or patient can pay out of pocket using good rx.

## 2024-08-04 ENCOUNTER — Other Ambulatory Visit: Payer: Self-pay | Admitting: Pediatrics

## 2024-08-04 DIAGNOSIS — R61 Generalized hyperhidrosis: Secondary | ICD-10-CM

## 2024-08-04 MED ORDER — XERAC AC 6.25 % EX SOLN
CUTANEOUS | 1 refills | Status: AC
Start: 2024-08-04 — End: ?

## 2024-08-04 NOTE — Progress Notes (Signed)
 Sent preferred drysol solution  Gail SHAUNNA Nett, MD

## 2024-08-12 DIAGNOSIS — F338 Other recurrent depressive disorders: Secondary | ICD-10-CM | POA: Diagnosis not present

## 2024-08-12 DIAGNOSIS — F411 Generalized anxiety disorder: Secondary | ICD-10-CM | POA: Diagnosis not present

## 2024-09-02 DIAGNOSIS — F411 Generalized anxiety disorder: Secondary | ICD-10-CM | POA: Diagnosis not present

## 2024-09-02 DIAGNOSIS — F338 Other recurrent depressive disorders: Secondary | ICD-10-CM | POA: Diagnosis not present

## 2024-09-24 DIAGNOSIS — F338 Other recurrent depressive disorders: Secondary | ICD-10-CM | POA: Diagnosis not present

## 2024-10-07 DIAGNOSIS — F338 Other recurrent depressive disorders: Secondary | ICD-10-CM | POA: Diagnosis not present

## 2024-10-19 ENCOUNTER — Telehealth: Admitting: Pediatrics

## 2024-10-21 DIAGNOSIS — F338 Other recurrent depressive disorders: Secondary | ICD-10-CM | POA: Diagnosis not present

## 2024-11-07 NOTE — Progress Notes (Signed)
  This encounter was created in error - please disregard. Patient no show today
